# Patient Record
Sex: Female | Born: 1965
Health system: Southern US, Community
[De-identification: ages and names within clinical notes are randomized; demographics above are authoritative.]

---

## 2012-08-24 HISTORY — PX: TOTAL ABDOMINAL HYSTERECTOMY: SHX209

## 2012-08-24 HISTORY — PX: ABDOMINAL HYSTERECTOMY: SHX81

## 2012-12-14 ENCOUNTER — Ambulatory Visit: Payer: Self-pay | Admitting: Obstetrics & Gynecology

## 2012-12-14 LAB — CBC
HCT: 38.1 % (ref 35.0–47.0)
HGB: 12.5 g/dL (ref 12.0–16.0)
MCH: 30.5 pg (ref 26.0–34.0)
MCHC: 33 g/dL (ref 32.0–36.0)
MCV: 92 fL (ref 80–100)
Platelet: 268 10*3/uL (ref 150–440)
RBC: 4.12 10*6/uL (ref 3.80–5.20)
RDW: 13.9 % (ref 11.5–14.5)
WBC: 8.9 10*3/uL (ref 3.6–11.0)

## 2012-12-14 LAB — PREGNANCY, URINE: Pregnancy Test, Urine: NEGATIVE m[IU]/mL

## 2012-12-23 ENCOUNTER — Inpatient Hospital Stay: Payer: Self-pay | Admitting: Obstetrics & Gynecology

## 2012-12-24 LAB — HEMOGLOBIN: HGB: 9 g/dL — ABNORMAL LOW (ref 12.0–16.0)

## 2012-12-26 LAB — PATHOLOGY REPORT

## 2014-12-14 NOTE — Op Note (Signed)
PATIENT NAME:  Teresa Gutierrez, Teresa Gutierrez MR#:  [id number] DATE OF BIRTH:  [birth year]  DATE OF PROCEDURE:  12/23/2012  PREOPERATIVE DIAGNOSIS:  Fibroid uterus.   POSTOPERATIVE DIAGNOSIS:  Fibroid uterus.  PROCEDURE:  Total abdominal hysterectomy, and Diagnostic Laparoscopy.   SURGEON:  Annamarie Major, M.D.   ASSISTANTLuella Cook .   ANESTHESIA:  General.   ESTIMATED BLOOD LOSS:  350 mL.   COMPLICATIONS:  None.   FINDINGS:  Normal tubes, ovaries, appendix and bowel. The patient has significantly enlarged globular fibroid uterus. On laparoscopic visualization, the uterus was too wide and enlarged to complete the surgery laparoscopically. Cystoscopy was also performed with normal bladder and bilateral patent ureters.   DISPOSITION:  To the recovery room in stable condition.   TECHNIQUE:  The patient is prepped and draped in the usual sterile fashion after adequate anesthesia is obtained in the dorsal lithotomy position. A Foley catheter is inserted. A VCare device is placed on the cervix after the uterus is sounded to 14 cm.   Attention is then turned to the abdomen where a Veress needle is inserted through a 5 mm infraumbilical incision after Marcaine is used to anesthetize the skin. Veress needle placement is confirmed using the hanging drop technique, and the abdomen is then insufflated with CO2 gas. A 5 mm trocar is then inserted under direct visualization with the laparoscope with no injuries or bleeding noted. The patient was placed in Trendelenburg positioning. A 5 mm trocar is placed in the left lower quadrant and an 11 mm trocar is placed in the right lower quadrant lateral to the inferior epigastric blood vessels with no injuries or bleeding noted. The above-mentioned findings are visualized. It is determined a laparoscopic hysterectomy would not be possible due to the size of the uterus and the shape of the uterus with very wide base and somewhat adhesed to the pelvic sidewall on the right side,  thus open surgery is determined.   Scalpel was used to create a low transverse skin incision that is carried out down to the level of the rectus fascia. The rectus fascia was dissected bilaterally using Mayo scissors. Rectus muscles were separated from the fascia and then separated in the midline. Peritoneum is penetrated and the intra-abdominal cavity is penetrated with no injuries or bleeding noted. A Balfour retractor is placed for better visualization, and the patient remains in Trendelenburg positioning. The trocars from the laparoscopy are then removed.   Fibroid is identified and is stabilized with clamps and tenaculum. The round ligaments are carefully clamped, transected and suture ligated using Vicryl sutures. Anterior and posterior leaves of the broad ligaments were dissected, and the infundibulopelvic blood vessels and ligaments and the uterine artery blood vessels and ligaments are carefully clamped, transected  and suture ligated with preservation of the main blood supply to the ovaries on either side. Dissection is carried around the fibroids, along the cardinal ligament and broad ligaments with careful clamping and transecting and suture ligation to maintain hemostasis. Once the level of the uterine arteries were reached, they were clamped, transected and suture ligated. The uterus was amputated at this point for better visualization purposes. The cervix was grasped with a tenaculum and careful dissection along either lateral aspect was performed until the level of the external os was reached. The cervix was then completely amputated, and the vaginal cuff was closed with interrupted Vicryl sutures. Irrigation of the pelvic cavity was performed with aspiration of all fluid, and excellent hemostasis is noted. There was no apparent  injury to bladder, bowel or ureters.   Cystoscopy was performed with saline distention of the bladder with no injuries or bleeding noted. Indigo carmine dye is injected  IV, and then was later noted to emit through each ureteral orifice. Cystoscope was removed and catheter was replaced.   Pelvic cavity was irrigated, aspiration of all fluid is performed. Excellent hemostasis is noted. The rectus muscles are plicated using 0 Vicryl suture. Rectus fascia is closed with 0 Maxon suture. Subcutaneous tissues are irrigated and hemostasis is assured using electrocautery. Skin is closed with 4-0 Vicryl suture in a subcuticular fashion, followed by placement of Dermabond.   Dermabond is also used to close the laparoscopic incisions. The patient goes to the recovery room in stable condition. All sponge, instrument and needle counts were correct.   ____________________________ R. Annamarie MajorPaul Ketzaly Cardella, MD rph:dmm D: 12/23/2012 13:36:02 ET T: 12/23/2012 22:39:34 ET JOB#: [id number]  cc: Dierdre Searles. Paul Dabney Schanz, MD, <Dictator> Nadara MustardOBERT P Alvester Eads MD ELECTRONICALLY SIGNED 12/24/2012 8:46

## 2015-05-21 ENCOUNTER — Other Ambulatory Visit: Payer: Self-pay | Admitting: Internal Medicine

## 2015-05-21 ENCOUNTER — Encounter: Payer: Self-pay | Admitting: Internal Medicine

## 2015-05-21 ENCOUNTER — Ambulatory Visit (INDEPENDENT_AMBULATORY_CARE_PROVIDER_SITE_OTHER): Payer: 59 | Admitting: Internal Medicine

## 2015-05-21 VITALS — BP 170/102 | HR 84 | Ht 67.0 in | Wt 156.2 lb

## 2015-05-21 DIAGNOSIS — K219 Gastro-esophageal reflux disease without esophagitis: Secondary | ICD-10-CM | POA: Insufficient documentation

## 2015-05-21 DIAGNOSIS — M170 Bilateral primary osteoarthritis of knee: Secondary | ICD-10-CM | POA: Diagnosis not present

## 2015-05-21 DIAGNOSIS — I10 Essential (primary) hypertension: Secondary | ICD-10-CM | POA: Insufficient documentation

## 2015-05-21 DIAGNOSIS — E785 Hyperlipidemia, unspecified: Secondary | ICD-10-CM | POA: Insufficient documentation

## 2015-05-21 DIAGNOSIS — F17200 Nicotine dependence, unspecified, uncomplicated: Secondary | ICD-10-CM | POA: Diagnosis not present

## 2015-05-21 DIAGNOSIS — F17201 Nicotine dependence, unspecified, in remission: Secondary | ICD-10-CM | POA: Insufficient documentation

## 2015-05-21 DIAGNOSIS — M62838 Other muscle spasm: Secondary | ICD-10-CM | POA: Insufficient documentation

## 2015-05-21 MED ORDER — VALSARTAN 160 MG PO TABS: 160.0000 mg | ORAL_TABLET | Freq: Every day | ORAL | Status: DC

## 2015-05-21 MED ORDER — RANITIDINE HCL 150 MG PO CAPS: 150.0000 mg | ORAL_CAPSULE | Freq: Every evening | ORAL | Status: DC

## 2015-05-21 NOTE — Progress Notes (Signed)
Date:  05/21/2015   Name:  Teresa Gutierrez   DOB:  28-Jul-1966   MRN:  [id number]   Chief Complaint: Hypertension Hypertension This is a new problem. The current episode started in the past 7 days. The problem is unchanged. The problem is uncontrolled. Pertinent negatives include no chest pain, headaches, palpitations or shortness of breath. There are no associated agents to hypertension. Risk factors for coronary artery disease include smoking/tobacco exposure. Past treatments include nothing. There is no history of a thyroid problem. There is no history of chronic renal disease.  Gastrophageal Reflux She complains of abdominal pain and heartburn. She reports no chest pain, no coughing, no globus sensation, no nausea, no sore throat, no water brash or no wheezing. This is a recurrent problem. The problem occurs frequently. The problem has been unchanged. Pertinent negatives include no fatigue. She has tried a PPI (Prilosec causes diarrhea) for the symptoms.   She started checking her blood pressure over the past few days just to see what it was. The last time she had a blood pressure check was probably a year ago. She has a home cuff because her husband has hypertension. Blood pressures at home up in 170/100 consistently. She denies the use of stimulants diet pills sinus medication. She does smoke cigarettes. Bilateral knee pain - patient has noted some creaking in both knees for some time. She is limited stair climbing and kneeling as a result. The right knee causes her to have discomfort at times but she's not had any redness or swelling. She was very athletic when she was younger and may have had some previous injuries.  Review of Systems:  Review of Systems  Constitutional: Negative for fever, fatigue and unexpected weight change.  HENT: Negative for sore throat.   Eyes: Negative for visual disturbance.  Respiratory: Negative for cough, chest tightness, shortness of breath and wheezing.     Cardiovascular: Negative for chest pain, palpitations and leg swelling.  Gastrointestinal: Positive for heartburn and abdominal pain. Negative for nausea, vomiting and constipation.  Musculoskeletal: Positive for arthralgias (creaking of both knees with intermittent right-sided pain).  Neurological: Negative for dizziness, syncope and headaches.  Psychiatric/Behavioral: Negative for dysphoric mood and decreased concentration.    Patient Active Problem List   Diagnosis Date Noted  . Hyperlipidemia 05/21/2015  . Compulsive tobacco user syndrome 05/21/2015  . Muscle spasms of neck 05/21/2015    Prior to Admission medications   Not on File    No Known Allergies  Past Surgical History  Procedure Laterality Date  . Abdominal hysterectomy  2014    Social History  Substance Use Topics  . Smoking status: Current Every Day Smoker  . Smokeless tobacco: None  . Alcohol Use: 1.2 oz/week    2 Standard drinks or equivalent per week     Medication list has been reviewed and updated.  Physical Examination:  Physical Exam  Constitutional: She is oriented to person, place, and time. She appears well-developed. No distress.  HENT:  Head: Normocephalic and atraumatic.  Eyes: Conjunctivae are normal. Right eye exhibits no discharge. Left eye exhibits no discharge. No scleral icterus.  Neck: Normal range of motion. Neck supple. No thyromegaly present.  Cardiovascular: Normal rate, regular rhythm and normal heart sounds.   Pulmonary/Chest: Effort normal and breath sounds normal. No respiratory distress.  Abdominal: Soft.  Musculoskeletal: She exhibits no edema or tenderness.       Legs: Neurological: She is alert and oriented to person, place, and time.  Skin:  Skin is warm and dry. No rash noted.  Psychiatric: She has a normal mood and affect. Her behavior is normal. Thought content normal.  Nursing note and vitals reviewed.   BP 172/94 mmHg  Pulse 84  Ht  (1.702 m)  Wt 156  lb 3.2 oz (70.852 kg)  BMI 24.46 kg/m2  Assessment and Plan: 1. Essential hypertension Begin treatment Patient will monitor blood pressures at bedtime if not responding within 4 weeks return for follow-up otherwise return in 3 months - valsartan (DIOVAN) 160 MG tablet; Take 1 tablet (160 mg total) by mouth daily.  Dispense: 30 tablet; Refill: 3  2. Compulsive tobacco user syndrome Discussed options today Patient will try nicotine patch  3. Gastroesophageal reflux disease without esophagitis Having daily symptoms so will recommend ranitidine every afternoon - ranitidine (ZANTAC) 150 MG capsule; Take 1 capsule (150 mg total) by mouth every evening.  Dispense: 30 capsule; Refill: 3  4. Primary osteoarthritis of both knees Crepitance noted of both knees without effusions May continue over-the-counter nonsteroidals if needed Orthopedic consultation if symptoms worsen   Bari Edward, MD [facility]  05/21/2015

## 2015-05-21 NOTE — Patient Instructions (Signed)
Smoking Cessation Quitting smoking is important to your health and has many advantages. However, it is not always easy to quit since nicotine is a very addictive drug. Oftentimes, people try 3 times or more before being able to quit. This document explains the best ways for you to prepare to quit smoking. Quitting takes hard work and a lot of effort, but you can do it. ADVANTAGES OF QUITTING SMOKING  You will live longer, feel better, and live better.  Your body will feel the impact of quitting smoking almost immediately.  Within 20 minutes, blood pressure decreases. Your pulse returns to its normal level.  After 8 hours, carbon monoxide levels in the blood return to normal. Your oxygen level increases.  After 24 hours, the chance of having a heart attack starts to decrease. Your breath, hair, and body stop smelling like smoke.  After 48 hours, damaged nerve endings begin to recover. Your sense of taste and smell improve.  After 72 hours, the body is virtually free of nicotine. Your bronchial tubes relax and breathing becomes easier.  After 2 to 12 weeks, lungs can hold more air. Exercise becomes easier and circulation improves.  The risk of having a heart attack, stroke, cancer, or lung disease is greatly reduced.  After 1 year, the risk of coronary heart disease is cut in half.  After 5 years, the risk of stroke falls to the same as a nonsmoker.  After 10 years, the risk of lung cancer is cut in half and the risk of other cancers decreases significantly.  After 15 years, the risk of coronary heart disease drops, usually to the level of a nonsmoker.  If you are pregnant, quitting smoking will improve your chances of having a healthy baby.  The people you live with, especially any children, will be healthier.  You will have extra money to spend on things other than cigarettes. QUESTIONS TO THINK ABOUT BEFORE ATTEMPTING TO QUIT You may want to talk about your answers with your  health care provider.  Why do you want to quit?  If you tried to quit in the past, what helped and what did not?  What will be the most difficult situations for you after you quit? How will you plan to handle them?  Who can help you through the tough times? Your family? Friends? A health care provider?  What pleasures do you get from smoking? What ways can you still get pleasure if you quit? Here are some questions to ask your health care provider:  How can you help me to be successful at quitting?  What medicine do you think would be best for me and how should I take it?  What should I do if I need more help?  What is smoking withdrawal like? How can I get information on withdrawal? GET READY  Set a quit date.  Change your environment by getting rid of all cigarettes, ashtrays, matches, and lighters in your home, car, or work. Do not let people smoke in your home.  Review your past attempts to quit. Think about what worked and what did not. GET SUPPORT AND ENCOURAGEMENT You have a better chance of being successful if you have help. You can get support in many ways.  Tell your family, friends, and coworkers that you are going to quit and need their support. Ask them not to smoke around you.  Get individual, group, or telephone counseling and support. Programs are available at local hospitals and health centers. Call   your local health department for information about programs in your area.  Spiritual beliefs and practices may help some smokers quit.  Download a "quit meter" on your computer to keep track of quit statistics, such as how long you have gone without smoking, cigarettes not smoked, and money saved.  Get a self-help book about quitting smoking and staying off tobacco. LEARN NEW SKILLS AND BEHAVIORS  Distract yourself from urges to smoke. Talk to someone, go for a walk, or occupy your time with a task.  Change your normal routine. Take a different route to work.  Drink tea instead of coffee. Eat breakfast in a different place.  Reduce your stress. Take a hot bath, exercise, or read a book.  Plan something enjoyable to do every day. Reward yourself for not smoking.  Explore interactive web-based programs that specialize in helping you quit. GET MEDICINE AND USE IT CORRECTLY Medicines can help you stop smoking and decrease the urge to smoke. Combining medicine with the above behavioral methods and support can greatly increase your chances of successfully quitting smoking.  Nicotine replacement therapy helps deliver nicotine to your body without the negative effects and risks of smoking. Nicotine replacement therapy includes nicotine gum, lozenges, inhalers, nasal sprays, and skin patches. Some may be available over-the-counter and others require a prescription.  Antidepressant medicine helps people abstain from smoking, but how this works is unknown. This medicine is available by prescription.  Nicotinic receptor partial agonist medicine simulates the effect of nicotine in your brain. This medicine is available by prescription. Ask your health care provider for advice about which medicines to use and how to use them based on your health history. Your health care provider will tell you what side effects to look out for if you choose to be on a medicine or therapy. Carefully read the information on the package. Do not use any other product containing nicotine while using a nicotine replacement product.  RELAPSE OR DIFFICULT SITUATIONS Most relapses occur within the first 3 months after quitting. Do not be discouraged if you start smoking again. Remember, most people try several times before finally quitting. You may have symptoms of withdrawal because your body is used to nicotine. You may crave cigarettes, be irritable, feel very hungry, cough often, get headaches, or have difficulty concentrating. The withdrawal symptoms are only temporary. They are strongest  when you first quit, but they will go away within 10-14 days. To reduce the chances of relapse, try to:  Avoid drinking alcohol. Drinking lowers your chances of successfully quitting.  Reduce the amount of caffeine you consume. Once you quit smoking, the amount of caffeine in your body increases and can give you symptoms, such as a rapid heartbeat, sweating, and anxiety.  Avoid smokers because they can make you want to smoke.  Do not let weight gain distract you. Many smokers will gain weight when they quit, usually less than 10 pounds. Eat a healthy diet and stay active. You can always lose the weight gained after you quit.  Find ways to improve your mood other than smoking. FOR MORE INFORMATION  www.smokefree.gov  Document Released: 08/04/2001 Document Revised: 12/25/2013 Document Reviewed: 11/19/2011 Exit[employer] Patient Information 2015 [employer]. This information is not intended to replace advice given to you by your health care provider. Make sure you discuss any questions you have with your health care provider.  

## 2015-07-01 ENCOUNTER — Other Ambulatory Visit: Payer: Self-pay

## 2015-07-02 ENCOUNTER — Encounter: Payer: Self-pay | Admitting: Internal Medicine

## 2015-07-02 ENCOUNTER — Ambulatory Visit (INDEPENDENT_AMBULATORY_CARE_PROVIDER_SITE_OTHER): Payer: 59 | Admitting: Internal Medicine

## 2015-07-02 VITALS — BP 136/90 | HR 80 | Ht 67.0 in | Wt 159.0 lb

## 2015-07-02 DIAGNOSIS — I10 Essential (primary) hypertension: Secondary | ICD-10-CM

## 2015-07-02 NOTE — Progress Notes (Signed)
Date:  07/02/2015   Name:  Teresa Gutierrez   DOB:  06/16/1966   MRN:  [id number]   Chief Complaint: Hypertension Hypertension This is a new problem. The current episode started more than 1 month ago. The problem has been gradually improving since onset. Pertinent negatives include no chest pain, headaches, palpitations or shortness of breath. Past treatments include angiotensin blockers. The current treatment provides mild improvement.   Patient was started on Diovan last visit for blood pressures 170/100. Over the past 5 weeks or pressures have only improved to about 155/95 when measured at home. She feels well, no headache, chest pain, muscle cramps or nausea. She does state that the Diovan is $50 per month.  Review of Systems  Constitutional: Negative for fatigue.  Respiratory: Negative for shortness of breath and wheezing.   Cardiovascular: Negative for chest pain, palpitations and leg swelling.  Gastrointestinal: Negative for nausea and diarrhea.  Musculoskeletal: Negative for myalgias and arthralgias.  Neurological: Negative for numbness and headaches.    Patient Active Problem List   Diagnosis Date Noted  . Hyperlipidemia 05/21/2015  . Compulsive tobacco user syndrome 05/21/2015  . Muscle spasms of neck 05/21/2015  . Gastroesophageal reflux disease without esophagitis 05/21/2015  . Essential hypertension 05/21/2015    Prior to Admission medications   Medication Sig Start Date End Date Taking? Authorizing Provider  ranitidine (ZANTAC) 150 MG capsule Take 1 capsule (150 mg total) by mouth every evening. 05/21/15  Yes Reubin MilanLaura H Khristina Janota, MD  valsartan (DIOVAN) 160 MG tablet Take 1 tablet (160 mg total) by mouth daily. 05/21/15  Yes Reubin MilanLaura H Kinnie Kaupp, MD    No Known Allergies  Past Surgical History  Procedure Laterality Date  . Abdominal hysterectomy  2014    Social History  Substance Use Topics  . Smoking status: Current Every Day Smoker  . Smokeless tobacco: Not on file  .  Alcohol Use: 1.2 oz/week    2 Standard drinks or equivalent per week     Medication list has been reviewed and updated.   Physical Exam  Constitutional: She is oriented to person, place, and time. She appears well-developed. No distress.  HENT:  Head: Normocephalic and atraumatic.  Eyes: Conjunctivae are normal. Right eye exhibits no discharge. Left eye exhibits no discharge. No scleral icterus.  Cardiovascular: Normal rate, regular rhythm and normal heart sounds.   Pulmonary/Chest: Effort normal and breath sounds normal. No respiratory distress. She has no wheezes.  Musculoskeletal: Normal range of motion. She exhibits no edema.  Neurological: She is alert and oriented to person, place, and time.  Skin: Skin is warm and dry. No rash noted.  Psychiatric: She has a normal mood and affect. Her behavior is normal. Thought content normal.    BP 136/90 mmHg  Pulse 80  Ht 5\' 7"  (1.702 m)  Wt 159 lb (72.122 kg)  BMI 24.90 kg/m2  Assessment and Plan: 1. Essential hypertension Blood pressure improved but not at goal Continue diovan for now Investigate others ARBs that are more cost effective Consider adding diuretic for additional benefit   Bari EdwardLaura Mat Stuard, MD [facility]  07/02/2015

## 2015-07-03 ENCOUNTER — Other Ambulatory Visit: Payer: Self-pay | Admitting: Internal Medicine

## 2015-07-03 MED ORDER — IRBESARTAN 300 MG PO TABS: 300.0000 mg | ORAL_TABLET | Freq: Every day | ORAL | Status: DC

## 2015-07-05 ENCOUNTER — Other Ambulatory Visit: Payer: Self-pay | Admitting: Internal Medicine

## 2015-07-05 MED ORDER — IRBESARTAN 300 MG PO TABS: 300.0000 mg | ORAL_TABLET | Freq: Every day | ORAL | Status: DC

## 2015-07-08 ENCOUNTER — Telehealth: Payer: Self-pay

## 2015-07-08 NOTE — Telephone Encounter (Signed)
I have sent the order for avapro to [facility] twice so far last week.  I don't know what else I can do.

## 2015-07-08 NOTE — Telephone Encounter (Signed)
Patient states insurance will pay for Avapro and [facility] is supposed to be send refill request.

## 2015-08-20 ENCOUNTER — Ambulatory Visit: Payer: Self-pay | Admitting: Internal Medicine

## 2015-08-29 ENCOUNTER — Ambulatory Visit (INDEPENDENT_AMBULATORY_CARE_PROVIDER_SITE_OTHER): Payer: 59 | Admitting: Internal Medicine

## 2015-08-29 ENCOUNTER — Encounter: Payer: Self-pay | Admitting: Internal Medicine

## 2015-08-29 VITALS — BP 128/86 | HR 84 | Ht 67.0 in | Wt 171.2 lb

## 2015-08-29 DIAGNOSIS — E785 Hyperlipidemia, unspecified: Secondary | ICD-10-CM | POA: Diagnosis not present

## 2015-08-29 DIAGNOSIS — I10 Essential (primary) hypertension: Secondary | ICD-10-CM | POA: Diagnosis not present

## 2015-08-29 DIAGNOSIS — K219 Gastro-esophageal reflux disease without esophagitis: Secondary | ICD-10-CM

## 2015-08-29 DIAGNOSIS — Z0000 Encounter for general adult medical examination without abnormal findings: Secondary | ICD-10-CM | POA: Diagnosis not present

## 2015-08-29 LAB — POCT URINALYSIS DIPSTICK
Bilirubin, UA: NEGATIVE
Blood, UA: NEGATIVE
Glucose, UA: NEGATIVE
Ketones, UA: NEGATIVE
Leukocytes, UA: NEGATIVE
Nitrite, UA: NEGATIVE
Protein, UA: NEGATIVE
Spec Grav, UA: 1.015
Urobilinogen, UA: 0.2
pH, UA: 6

## 2015-08-29 NOTE — Patient Instructions (Signed)
Breast Self-Awareness Practicing breast self-awareness may pick up problems early, prevent significant medical complications, and possibly save your life. By practicing breast self-awareness, you can become familiar with how your breasts look and feel and if your breasts are changing. This allows you to notice changes early. It can also offer you some reassurance that your breast health is good. One way to learn what is normal for your breasts and whether your breasts are changing is to do a breast self-exam. If you find a lump or something that was not present in the past, it is best to contact your caregiver right away. Other findings that should be evaluated by your caregiver include nipple discharge, especially if it is bloody; skin changes or reddening; areas where the skin seems to be pulled in (retracted); or new lumps and bumps. Breast pain is seldom associated with cancer (malignancy), but should also be evaluated by a caregiver. HOW TO PERFORM A BREAST SELF-EXAM The best time to examine your breasts is 5-7 days after your menstrual period is over. During menstruation, the breasts are lumpier, and it may be more difficult to pick up changes. If you do not menstruate, have reached menopause, or had your uterus removed (hysterectomy), you should examine your breasts at regular intervals, such as monthly. If you are breastfeeding, examine your breasts after a feeding or after using a breast pump. Breast implants do not decrease the risk for lumps or tumors, so continue to perform breast self-exams as recommended. Talk to your caregiver about how to determine the difference between the implant and breast tissue. Also, talk about the amount of pressure you should use during the exam. Over time, you will become more familiar with the variations of your breasts and more comfortable with the exam. A breast self-exam requires you to remove all your clothes above the waist. 1. Look at your breasts and nipples.  Stand in front of a mirror in a room with good lighting. With your hands on your hips, push your hands firmly downward. Look for a difference in shape, contour, and size from one breast to the other (asymmetry). Asymmetry includes puckers, dips, or bumps. Also, look for skin changes, such as reddened or scaly areas on the breasts. Look for nipple changes, such as discharge, dimpling, repositioning, or redness. 2. Carefully feel your breasts. This is best done either in the shower or tub while using soapy water or when flat on your back. Place the arm (on the side of the breast you are examining) above your head. Use the pads (not the fingertips) of your three middle fingers on your opposite hand to feel your breasts. Start in the underarm area and use  inch (2 cm) overlapping circles to feel your breast. Use 3 different levels of pressure (light, medium, and firm pressure) at each circle before moving to the next circle. The light pressure is needed to feel the tissue closest to the skin. The medium pressure will help to feel breast tissue a little deeper, while the firm pressure is needed to feel the tissue close to the ribs. Continue the overlapping circles, moving downward over the breast until you feel your ribs below your breast. Then, move one finger-width towards the center of the body. Continue to use the  inch (2 cm) overlapping circles to feel your breast as you move slowly up toward the collar bone (clavicle) near the base of the neck. Continue the up and down exam using all 3 pressures until you reach the   middle of the chest. Do this with each breast, carefully feeling for lumps or changes. 3.  Keep a written record with breast changes or normal findings for each breast. By writing this information down, you do not need to depend only on memory for size, tenderness, or location. Write down where you are in your menstrual cycle, if you are still menstruating. Breast tissue can have some lumps or  thick tissue. However, see your caregiver if you find anything that concerns you.  SEEK MEDICAL CARE IF:  You see a change in shape, contour, or size of your breasts or nipples.   You see skin changes, such as reddened or scaly areas on the breasts or nipples.   You have an unusual discharge from your nipples.   You feel a new lump or unusually thick areas.    This information is not intended to replace advice given to you by your health care provider. Make sure you discuss any questions you have with your health care provider.   Document Released: 08/10/2005 Document Revised: 07/27/2012 Document Reviewed: 11/25/2011 Elsevier Interactive Patient Education 2016 Elsevier Inc.  

## 2015-08-29 NOTE — Progress Notes (Signed)
Date:  08/29/2015   Name:  Teresa Gutierrez   DOB:  1965/10/26   MRN:  [id number]   Chief Complaint: Annual Exam; Hypertension; and Gastroesophageal Reflux Teresa Gutierrez is a 50 y.o. female who presents today for her Complete Annual Exam. She feels well. She reports exercising none. She reports she is sleeping well. Mammogram is due. She [facility]. She continues to smoke cigarettes but is cutting back. She declines flu vaccine and she declines tetanus vaccine.  Hypertension This is a chronic problem. The current episode started more than 1 month ago (in the past 6 months - now on avapro). The problem is unchanged. The problem is controlled. Pertinent negatives include no chest pain, headaches, orthopnea, palpitations or shortness of breath. There are no associated agents to hypertension. Risk factors for coronary artery disease include smoking/tobacco exposure. Past treatments include angiotensin blockers. The current treatment provides significant improvement. There are no compliance problems.   Gastroesophageal Reflux She complains of heartburn. She reports no abdominal pain, no chest pain, no coughing or no wheezing. This is a chronic problem. The current episode started more than 1 year ago. The problem occurs rarely. The problem has been unchanged. The treatment provided significant relief.     Review of Systems  Constitutional: Negative for fever, chills and diaphoresis.  HENT: Negative for sinus pressure, tinnitus and trouble swallowing.   Respiratory: Negative for cough, chest tightness, shortness of breath and wheezing.   Cardiovascular: Negative for chest pain, palpitations, orthopnea and leg swelling.  Gastrointestinal: Positive for heartburn. Negative for abdominal pain and constipation.  Genitourinary: Negative for dysuria.  Musculoskeletal: Negative for back pain, arthralgias and gait problem.  Skin: Positive for rash (lesion on back - scaly). Negative for color  change.  Neurological: Negative for syncope and headaches.  Hematological: Negative for adenopathy.  Psychiatric/Behavioral: Negative for sleep disturbance and dysphoric mood. The patient is not nervous/anxious.     Patient Active Problem List   Diagnosis Date Noted  . Hyperlipidemia 05/21/2015  . Compulsive tobacco user syndrome 05/21/2015  . Muscle spasms of neck 05/21/2015  . Gastroesophageal reflux disease without esophagitis 05/21/2015  . Essential hypertension 05/21/2015    Prior to Admission medications   Medication Sig Start Date End Date Taking? Authorizing Provider  Biotin 5000 MCG CAPS Take by mouth.   Yes Historical Provider, MD  Cholecalciferol (VITAMIN D-3) 1000 units CAPS Take by mouth.   Yes Historical Provider, MD  irbesartan (AVAPRO) 300 MG tablet Take 1 tablet (300 mg total) by mouth daily. 07/05/15  Yes Reubin MilanLaura H Berglund, MD  ranitidine (ZANTAC) 150 MG capsule Take 1 capsule (150 mg total) by mouth every evening. 05/21/15  Yes Reubin MilanLaura H Berglund, MD    No Known Allergies  Past Surgical History  Procedure Laterality Date  . Abdominal hysterectomy  2014    ovaries remain    Social History  Substance Use Topics  . Smoking status: Current Every Day Smoker -- 0.50 packs/day for 22 years    Types: Cigarettes  . Smokeless tobacco: None  . Alcohol Use: 1.2 oz/week    2 Standard drinks or equivalent per week    Medication list has been reviewed and updated.   Physical Exam  Constitutional: She is oriented to person, place, and time. She appears well-developed and well-nourished. No distress.  HENT:  Head: Normocephalic and atraumatic.  Nose: Right sinus exhibits no maxillary sinus tenderness. Left sinus exhibits no maxillary sinus tenderness.  Mouth/Throat: Oropharynx is clear and moist.  Eyes: Conjunctivae and EOM are normal. Right eye exhibits no discharge. Left eye exhibits no discharge. No scleral icterus.  Neck: Normal range of motion. Neck supple.  Carotid bruit is not present. No erythema present. No thyromegaly present.  Cardiovascular: Normal rate, regular rhythm, normal heart sounds and normal pulses.   Pulmonary/Chest: Effort normal and breath sounds normal. No respiratory distress. She has no wheezes.  Abdominal: Soft. Bowel sounds are normal. There is no hepatosplenomegaly. There is no tenderness. There is no CVA tenderness.  Musculoskeletal: Normal range of motion.  Lymphadenopathy:    She has no cervical adenopathy.    She has no axillary adenopathy.  Neurological: She is alert and oriented to person, place, and time. She has normal reflexes. No cranial nerve deficit or sensory deficit.  Skin: Skin is warm, dry and intact. No rash noted.     Psychiatric: She has a normal mood and affect. Her speech is normal and behavior is normal. Thought content normal.  Nursing note and vitals reviewed.   BP 128/86 mmHg  Pulse 84  Ht 5\' 7"  (1.702 m)  Wt 171 lb 3.2 oz (77.656 kg)  BMI 26.81 kg/m2  Assessment and Plan: 1. Annual physical exam Pap pelvic and mammograms through GYN Patient declines flu vaccine and tetanus vaccine Will need colonoscopy next year Lesion on back is a keratosis - can consult dermatology for removal if persistently irritated - POCT Urinalysis Dipstick  2. Essential hypertension Well-controlled; continue current medication; f/u 6 mo. - Comprehensive metabolic panel - TSH  3. Gastroesophageal reflux disease without esophagitis Continue daily H2 blocker - CBC with Differential/Platelet  4. Hyperlipidemia Continue healthy diet and exercise Will advise if medications are needed - Lipid panel   Bari Edward, MD [facility]  08/29/2015

## 2015-08-30 ENCOUNTER — Telehealth: Payer: Self-pay

## 2015-08-30 LAB — CBC WITH DIFFERENTIAL/PLATELET
Basophils Absolute: 0 10*3/uL (ref 0.0–0.2)
Basos: 0 %
EOS (ABSOLUTE): 0.2 10*3/uL (ref 0.0–0.4)
Eos: 1 %
Hematocrit: 40.2 % (ref 34.0–46.6)
Hemoglobin: 13.5 g/dL (ref 11.1–15.9)
Immature Grans (Abs): 0 10*3/uL (ref 0.0–0.1)
Immature Granulocytes: 0 %
Lymphocytes Absolute: 2 10*3/uL (ref 0.7–3.1)
Lymphs: 19 %
MCH: 31.2 pg (ref 26.6–33.0)
MCHC: 33.6 g/dL (ref 31.5–35.7)
MCV: 93 fL (ref 79–97)
Monocytes Absolute: 0.7 10*3/uL (ref 0.1–0.9)
Monocytes: 6 %
Neutrophils Absolute: 7.5 10*3/uL — ABNORMAL HIGH (ref 1.4–7.0)
Neutrophils: 74 %
Platelets: 268 10*3/uL (ref 150–379)
RBC: 4.33 x10E6/uL (ref 3.77–5.28)
RDW: 13.5 % (ref 12.3–15.4)
WBC: 10.4 10*3/uL (ref 3.4–10.8)

## 2015-08-30 LAB — COMPREHENSIVE METABOLIC PANEL
ALT: 11 IU/L (ref 0–32)
AST: 23 IU/L (ref 0–40)
Albumin/Globulin Ratio: 2.4 (ref 1.1–2.5)
Albumin: 4.8 g/dL (ref 3.5–5.5)
Alkaline Phosphatase: 106 IU/L (ref 39–117)
BUN/Creatinine Ratio: 18 (ref 9–23)
BUN: 12 mg/dL (ref 6–24)
Bilirubin Total: 0.7 mg/dL (ref 0.0–1.2)
CO2: 23 mmol/L (ref 18–29)
Calcium: 9.5 mg/dL (ref 8.7–10.2)
Chloride: 99 mmol/L (ref 96–106)
Creatinine, Ser: 0.68 mg/dL (ref 0.57–1.00)
GFR calc Af Amer: 119 mL/min/{1.73_m2} (ref >59)
GFR calc non Af Amer: 103 mL/min/{1.73_m2} (ref >59)
Globulin, Total: 2 g/dL (ref 1.5–4.5)
Glucose: 100 mg/dL — ABNORMAL HIGH (ref 65–99)
Potassium: 4.3 mmol/L (ref 3.5–5.2)
Sodium: 140 mmol/L (ref 134–144)
Total Protein: 6.8 g/dL (ref 6.0–8.5)

## 2015-08-30 LAB — LIPID PANEL
Chol/HDL Ratio: 4.9 ratio units — ABNORMAL HIGH (ref 0.0–4.4)
Cholesterol, Total: 220 mg/dL — ABNORMAL HIGH (ref 100–199)
HDL: 45 mg/dL (ref >39)
LDL Calculated: 131 mg/dL — ABNORMAL HIGH (ref 0–99)
Triglycerides: 220 mg/dL — ABNORMAL HIGH (ref 0–149)
VLDL Cholesterol Cal: 44 mg/dL — ABNORMAL HIGH (ref 5–40)

## 2015-08-30 LAB — TSH: TSH: 3.88 u[IU]/mL (ref 0.450–4.500)

## 2015-08-30 NOTE — Telephone Encounter (Signed)
-----   Message from Reubin MilanLaura H Berglund, MD sent at 08/30/2015  8:06 AM EST ----- Labs are all good except continued borderline high cholesterol.  10 yr risk of ASCVD is 6% so medication is not recommended yet.  Continue to work on smoking cessation since this will reduce risk to 3%.

## 2015-08-30 NOTE — Telephone Encounter (Signed)
Spoke with pt. Pt. Advised of results and will call back with any questions or concerns. Patient verbalized understanding. [facility]

## 2015-11-26 ENCOUNTER — Other Ambulatory Visit: Payer: Self-pay | Admitting: Internal Medicine

## 2015-12-02 ENCOUNTER — Ambulatory Visit (INDEPENDENT_AMBULATORY_CARE_PROVIDER_SITE_OTHER): Payer: 59

## 2015-12-02 ENCOUNTER — Ambulatory Visit (INDEPENDENT_AMBULATORY_CARE_PROVIDER_SITE_OTHER)
Admit: 2015-12-02 | Discharge: 2015-12-02 | Disposition: A | Discharge status: Home / Self Care | Payer: 59 | Attending: Family Medicine | Admitting: Family Medicine

## 2015-12-02 ENCOUNTER — Ambulatory Visit
Admission: EM | Admit: 2015-12-02 | Discharge: 2015-12-02 | Disposition: A | Discharge status: Home / Self Care | Payer: 59 | Attending: Family Medicine | Admitting: Family Medicine

## 2015-12-02 ENCOUNTER — Encounter: Payer: Self-pay | Admitting: *Deleted

## 2015-12-02 DIAGNOSIS — Z833 Family history of diabetes mellitus: Secondary | ICD-10-CM | POA: Diagnosis not present

## 2015-12-02 DIAGNOSIS — R918 Other nonspecific abnormal finding of lung field: Secondary | ICD-10-CM | POA: Insufficient documentation

## 2015-12-02 DIAGNOSIS — M940 Chondrocostal junction syndrome [Tietze]: Secondary | ICD-10-CM

## 2015-12-02 DIAGNOSIS — I10 Essential (primary) hypertension: Secondary | ICD-10-CM | POA: Insufficient documentation

## 2015-12-02 DIAGNOSIS — I459 Conduction disorder, unspecified: Secondary | ICD-10-CM | POA: Diagnosis not present

## 2015-12-02 DIAGNOSIS — J439 Emphysema, unspecified: Secondary | ICD-10-CM

## 2015-12-02 DIAGNOSIS — R109 Unspecified abdominal pain: Secondary | ICD-10-CM | POA: Insufficient documentation

## 2015-12-02 DIAGNOSIS — R938 Abnormal findings on diagnostic imaging of other specified body structures: Secondary | ICD-10-CM | POA: Diagnosis not present

## 2015-12-02 DIAGNOSIS — F17210 Nicotine dependence, cigarettes, uncomplicated: Secondary | ICD-10-CM | POA: Insufficient documentation

## 2015-12-02 DIAGNOSIS — Z8249 Family history of ischemic heart disease and other diseases of the circulatory system: Secondary | ICD-10-CM | POA: Insufficient documentation

## 2015-12-02 DIAGNOSIS — M549 Dorsalgia, unspecified: Secondary | ICD-10-CM | POA: Diagnosis present

## 2015-12-02 LAB — CBC WITH DIFFERENTIAL/PLATELET
Basophils Absolute: 0.2 10*3/uL — ABNORMAL HIGH (ref 0–0.1)
Basophils Relative: 1 %
Eosinophils Absolute: 0.2 10*3/uL (ref 0–0.7)
Eosinophils Relative: 1 %
HCT: 45.9 % (ref 35.0–47.0)
Hemoglobin: 15.5 g/dL (ref 12.0–16.0)
Lymphocytes Relative: 15 %
Lymphs Abs: 2.2 10*3/uL (ref 1.0–3.6)
MCH: 31.1 pg (ref 26.0–34.0)
MCHC: 33.8 g/dL (ref 32.0–36.0)
MCV: 92.2 fL (ref 80.0–100.0)
Monocytes Absolute: 0.9 10*3/uL (ref 0.2–0.9)
Monocytes Relative: 6 %
Neutro Abs: 11.9 10*3/uL — ABNORMAL HIGH (ref 1.4–6.5)
Neutrophils Relative %: 77 %
Platelets: 299 10*3/uL (ref 150–440)
RBC: 4.98 MIL/uL (ref 3.80–5.20)
RDW: 13.2 % (ref 11.5–14.5)
WBC: 15.4 10*3/uL — ABNORMAL HIGH (ref 3.6–11.0)

## 2015-12-02 LAB — COMPREHENSIVE METABOLIC PANEL
ALT: 15 U/L (ref 14–54)
AST: 17 U/L (ref 15–41)
Albumin: 5 g/dL (ref 3.5–5.0)
Alkaline Phosphatase: 122 U/L (ref 38–126)
Anion gap: 8 (ref 5–15)
BUN: 13 mg/dL (ref 6–20)
CO2: 26 mmol/L (ref 22–32)
Calcium: 9.6 mg/dL (ref 8.9–10.3)
Chloride: 102 mmol/L (ref 101–111)
Creatinine, Ser: 0.65 mg/dL (ref 0.44–1.00)
GFR calc Af Amer: >60 mL/min (ref >60)
GFR calc non Af Amer: >60 mL/min (ref >60)
Glucose, Bld: 96 mg/dL (ref 65–99)
Potassium: 4 mmol/L (ref 3.5–5.1)
Sodium: 136 mmol/L (ref 135–145)
Total Bilirubin: 0.6 mg/dL (ref 0.3–1.2)
Total Protein: 7.7 g/dL (ref 6.5–8.1)

## 2015-12-02 LAB — CKMB (ARMC ONLY): CK, MB: 1.1 ng/mL (ref 0.5–5.0)

## 2015-12-02 LAB — CK: Total CK: 49 U/L (ref 38–234)

## 2015-12-02 LAB — FIBRIN DERIVATIVES D-DIMER (ARMC ONLY): Fibrin derivatives D-dimer (ARMC): 273 (ref 0–499)

## 2015-12-02 LAB — TROPONIN I: Troponin I: <0.03 ng/mL (ref <0.031)

## 2015-12-02 MED ORDER — KETOROLAC TROMETHAMINE 60 MG/2ML IM SOLN
60.0000 mg | Freq: Once | INTRAMUSCULAR | Status: AC
  Administered 2015-12-02: 60 mg via INTRAMUSCULAR

## 2015-12-02 MED ORDER — MELOXICAM 15 MG PO TABS: 15.0000 mg | ORAL_TABLET | Freq: Every day | ORAL | Status: DC

## 2015-12-02 NOTE — ED Notes (Signed)
LUQ abd pain that radiates to left shoulder and back, onset yesterday morning. Worse with deep breathing or movement.

## 2015-12-02 NOTE — Discharge Instructions (Signed)
Chest Wall Pain Chest wall pain is pain in or around the bones and muscles of your chest. Sometimes, an injury causes this pain. Sometimes, the cause may not be known. This pain may take several weeks or longer to get better. HOME CARE Pay attention to any changes in your symptoms. Take these actions to help with your pain:  Rest as told by your doctor.  Avoid activities that cause pain. Try not to use your chest, belly (abdominal), or side muscles to lift heavy things.  If directed, apply ice to the painful area:  Put ice in a plastic bag.  Place a towel between your skin and the bag.  Leave the ice on for 20 minutes, 2-3 times per day.  Take over-the-counter and prescription medicines only as told by your doctor.  Do not use tobacco products, including cigarettes, chewing tobacco, and e-cigarettes. If you need help quitting, ask your doctor.  Keep all follow-up visits as told by your doctor. This is important. GET HELP IF:  You have a fever.  Your chest pain gets worse.  You have new symptoms. GET HELP RIGHT AWAY IF:  You feel sick to your stomach (nauseous) or you throw up (vomit).  You feel sweaty or light-headed.  You have a cough with phlegm (sputum) or you cough up blood.  You are short of breath.   This information is not intended to replace advice given to you by your health care provider. Make sure you discuss any questions you have with your health care provider.   Document Released: 01/27/2008 Document Revised: 05/01/2015 Document Reviewed: 11/05/2014 Elsevier Interactive Patient Education 2016 Elsevier Inc.  Chronic Obstructive Pulmonary Disease Chronic obstructive pulmonary disease (COPD) is a common lung problem. In COPD, the flow of air from the lungs is limited. The way your lungs work will probably never return to normal, but there are things you can do to improve your lungs and make yourself feel better. Your doctor may treat your condition  with:  Medicines.  Oxygen.  Lung surgery.  Changes to your diet.  Rehabilitation. This may involve a team of specialists. HOME CARE  Take all medicines as told by your doctor.  Avoid medicines or cough syrups that dry up your airway (such as antihistamines) and do not allow you to get rid of thick spit. You do not need to avoid them if told differently by your doctor.  If you smoke, stop. Smoking makes the problem worse.  Avoid being around things that make your breathing worse (like smoke, chemicals, and fumes).  Use oxygen therapy and therapy to help improve your lungs (pulmonary rehabilitation) if told by your doctor. If you need home oxygen therapy, ask your doctor if you should buy a tool to measure your oxygen level (oximeter).  Avoid people who have a sickness you can catch (contagious).  Avoid going outside when it is very hot, cold, or humid.  Eat healthy foods. Eat smaller meals more often. Rest before meals.  Stay active, but remember to also rest.  Make sure to get all the shots (vaccines) your doctor recommends. Ask your doctor if you need a pneumonia shot.  Learn and use tips on how to relax.  Learn and use tips on how to control your breathing as told by your doctor. Try:  Breathing in (inhaling) through your nose for 1 second. Then, pucker your lips and breath out (exhale) through your lips for 2 seconds.  Putting one hand on your belly (abdomen). Breathe  in slowly through your nose for 1 second. Your hand on your belly should move out. Pucker your lips and breathe out slowly through your lips. Your hand on your belly should move in as you breathe out.  Learn and use controlled coughing to clear thick spit from your lungs. The steps are:  Lean your head a little forward.  Breathe in deeply.  Try to hold your breath for 3 seconds.  Keep your mouth slightly open while coughing 2 times.  Spit any thick spit out into a tissue.  Rest and do the steps  again 1 or 2 times as needed. GET HELP IF:  You cough up more thick spit than usual.  There is a change in the color or thickness of the spit.  It is harder to breathe than usual.  Your breathing is faster than usual. GET HELP RIGHT AWAY IF:  You have shortness of breath while resting.  You have shortness of breath that stops you from:  Being able to talk.  Doing normal activities.  You chest hurts for longer than 5 minutes.  Your skin color is more blue than usual.  Your pulse oximeter shows that you have low oxygen for longer than 5 minutes. MAKE SURE YOU:  Understand these instructions.  Will watch your condition.  Will get help right away if you are not doing well or get worse.   This information is not intended to replace advice given to you by your health care provider. Make sure you discuss any questions you have with your health care provider.   Document Released: 01/27/2008 Document Revised: 08/31/2014 Document Reviewed: 04/06/2013 Elsevier Interactive Patient Education 2016 [employer].  Costochondritis Costochondritis is a condition in which the tissue (cartilage) that connects your ribs with your breastbone (sternum) becomes irritated. It causes pain in the chest and rib area. It usually goes away on its own over time. HOME CARE  Avoid activities that wear you out.  Do not strain your ribs. Avoid activities that use your:  Chest.  Belly.  Side muscles.  Put ice on the area for the first 2 days after the pain starts.  Put ice in a plastic bag.  Place a towel between your skin and the bag.  Leave the ice on for 20 minutes, 2-3 times a day.  Only take medicine as told by your doctor. GET HELP IF:  You have redness or puffiness (swelling) in the rib area.  Your pain does not go away with rest or medicine. GET HELP RIGHT AWAY IF:   Your pain gets worse.  You are very uncomfortable.  You have trouble breathing.  You cough up  blood.  You start sweating or throwing up (vomiting).  You have a fever or lasting symptoms for more than 2-3 days.  You have a fever and your symptoms suddenly get worse. MAKE SURE YOU:   Understand these instructions.  Will watch your condition.  Will get help right away if you are not doing well or get worse.   This information is not intended to replace advice given to you by your health care provider. Make sure you discuss any questions you have with your health care provider.   Document Released: 01/27/2008 Document Revised: 04/12/2013 Document Reviewed: 03/14/2013 Elsevier Interactive Patient Education 2016 Else[employer] Inc. Pulmonary Nodule  A pulmonary nodule is a small, round spot in your lung. It is usually found when pictures of your lungs are taken for other reasons. Most pulmonary nodules are  not cancerous and do not cause symptoms. Tests will be done to make sure the nodule is not cancerous. Pulmonary nodules that are not cancerous usually do not require treatment. HOME CARE   Only take medicine as told by your doctor.  Follow up with your doctor as told. GET HELP IF:  You have trouble breathing when doing activities.  You feel sick or more tired than normal.  You do not feel like eating.  You lose weight without trying to.  You have chills.  You have night sweats. GET HELP RIGHT AWAY IF:  You cannot catch your breath.  You start making whistling sounds when breathing (wheezing).  You have a cough that does not go away.  You cough up blood.  You are dizzy or feel like you are going to pass out.  You have sudden chest pain.  You have a fever or lasting symptoms for more than 2-3 days.  You have a fever and your symptoms suddenly get worse. MAKE SURE YOU:  Understand these instructions.  Will watch your condition.  Will get help right away if you are not doing well or get worse.   This information is not intended to replace advice given to you  by your health care provider. Make sure you discuss any questions you have with your health care provider.   Document Released: 09/12/2010 Document Revised: 12/25/2014 Document Reviewed: 01/30/2013 Elsevier Interactive Patient Education [employer]

## 2015-12-02 NOTE — ED Notes (Signed)
Received prior authorization for CT chest without contrast from patient's insurance [id number].

## 2015-12-02 NOTE — ED Provider Notes (Signed)
CSN: [id number]     Arrival date & time 12/02/15  3818 History   First MD Initiated Contact with Patient 12/02/15 0848    Nurses notes were reviewed.  Chief Complaint  Patient presents with  . Abdominal Pain  . Back Pain    Patient reports having chest pain that started yesterday pain developed from her left lower chest/upper abdomen and upper chest and down the left arm and upper neck. She's had this pain since yesterday and it has been persistent and she describes pain at about 8 out of 10 now. Patient denies any injury to the chest no known history of coronary artery disease. Patient does smoke does have hypertension as well. Past history no known drug allergies she has had abdominal hysterectomy. Mother has had hypertension and heart failure and diabetes.  She denies any nausea vomiting or diarrhea. No chest congestion. No known history sudden death in the family.     (Consider location/radiation/quality/duration/timing/severity/associated sxs/prior Treatment) Patient is a 50 y.o. female presenting with abdominal pain, back pain, and chest pain. The history is provided by the patient and a significant other. No language interpreter was used.  Abdominal Pain Pain location:  LUQ Pain quality: pressure, sharp, squeezing and stabbing   Pain radiates to:  LUQ Onset quality:  Sudden Progression:  Worsening Chronicity:  New Context: not recent illness, not recent sexual activity, not sick contacts and not suspicious food intake   Relieved by:  Nothing Worsened by:  Movement Ineffective treatments:  None tried Associated symptoms: chest pain   Associated symptoms: no cough, no dysuria, no fatigue and no shortness of breath   Back Pain Associated symptoms: abdominal pain and chest pain   Associated symptoms: no dysuria   Chest Pain Pain location:  Substernal area, L chest and epigastric Pain quality: radiating, sharp, shooting and stabbing   Pain radiates to:  Neck, L arm, epigastrium  and L jaw Pain severity:  Moderate Onset quality:  Sudden Timing:  Constant Chronicity:  New Context: breathing, lifting, movement and at rest   Associated symptoms: abdominal pain   Associated symptoms: no back pain, no cough, no dizziness, no fatigue and no shortness of breath   Risk factors: hypertension and smoking   Risk factors: no coronary artery disease, no high cholesterol, not female, no prior DVT/PE and no surgery     History reviewed. No pertinent past medical history. Past Surgical History  Procedure Laterality Date  . Abdominal hysterectomy  2014    ovaries remain   Family History  Problem Relation Age of Onset  . Hypertension Mother   . Heart failure Mother   . Diabetes Mother    Social History  Substance Use Topics  . Smoking status: Current Every Day Smoker -- 0.50 packs/day for 22 years    Types: Cigarettes  . Smokeless tobacco: None  . Alcohol Use: 1.2 oz/week    2 Standard drinks or equivalent per week   OB History    No data available     Review of Systems  Constitutional: Negative for activity change, appetite change and fatigue.  HENT: Negative for congestion and ear discharge.   Respiratory: Negative for cough, shortness of breath and wheezing.   Cardiovascular: Positive for chest pain.  Gastrointestinal: Positive for abdominal pain.  Genitourinary: Negative for dysuria, frequency and dyspareunia.  Musculoskeletal: Negative for myalgias, back pain, joint swelling and neck pain.  Skin: Negative for color change.  Neurological: Negative for dizziness.  All other systems reviewed and  are negative.   Allergies  Review of patient's allergies indicates no known allergies.  Home Medications   Prior to Admission medications   Medication Sig Start Date End Date Taking? Authorizing Provider  Biotin 5000 MCG CAPS Take by mouth.   Yes Historical Provider, MD  Cholecalciferol (VITAMIN D-3) 1000 units CAPS Take by mouth.   Yes Historical Provider, MD    irbesartan (AVAPRO) 300 MG tablet Take 1 tablet by mouth  daily 11/26/15  Yes Reubin Milan, MD  ranitidine (ZANTAC) 150 MG capsule Take 1 capsule (150 mg total) by mouth every evening. 05/21/15  Yes Reubin Milan, MD  meloxicam (MOBIC) 15 MG tablet Take 1 tablet (15 mg total) by mouth daily. 12/02/15   Hassan Rowan, MD   Meds Ordered and Administered this Visit   Medications  ketorolac (TORADOL) injection 60 mg (60 mg Intramuscular Given 12/02/15 0942)    BP 150/99 mmHg  Pulse 89  Temp(Src) 98.2 F (36.8 C) (Oral)  Resp 16  Ht  (1.702 m)  Wt 155 lb (70.308 kg)  BMI 24.27 kg/m2  SpO2 100% No data found.   Physical Exam  Constitutional: She is oriented to person, place, and time. She appears well-developed and well-nourished. No distress.  HENT:  Head: Normocephalic and atraumatic.  Right Ear: External ear normal.  Left Ear: External ear normal.  Mouth/Throat: Oropharynx is clear and moist.  Eyes: Conjunctivae are normal. Pupils are equal, round, and reactive to light.  Neck: Normal range of motion.  Cardiovascular: Normal rate and regular rhythm.  Exam reveals no gallop and no friction rub.   No murmur heard. Pulmonary/Chest: Effort normal and breath sounds normal. No respiratory distress. She has no decreased breath sounds. She has no wheezes. She exhibits tenderness.    Abdominal: Soft. There is tenderness.  Musculoskeletal: Normal range of motion.  Neurological: She is alert and oriented to person, place, and time.  Skin: Skin is warm and dry.  Psychiatric: She has a normal mood and affect.  Vitals reviewed.   ED Course  Procedures (including critical care time)  Labs Review Labs Reviewed  CBC WITH DIFFERENTIAL/PLATELET - Abnormal; Notable for the following:    WBC 15.4 (*)    Neutro Abs 11.9 (*)    Basophils Absolute 0.2 (*)    All other components within normal limits  COMPREHENSIVE METABOLIC PANEL  TROPONIN I  FIBRIN DERIVATIVES D-DIMER ([facility] ONLY)   CK  CKMB([facility] ONLY)    Imaging Review Dg Chest 2 View  12/02/2015  CLINICAL DATA:  Bilateral rib pain.  History of bronchitis. EXAM: CHEST  2 VIEW COMPARISON:  No prior. FINDINGS: Mediastinum and hilar structures are normal. 8 mm slightly irregular nodule in the left apex. This may be calcified. Questionable density is noted in the right lower lobe medially. This may be vascular. For further evaluation these findings nonenhanced chest CT is suggested. No focal alveolar infiltrate. Heart size normal. No pleural effusion or pneumothorax. No acute bony abnormality. IMPRESSION: 8 mm slightly irregular nodule noted in the left pulmonary apex, possibly calcified. Questionable nodular density in the medial right lung base. This may be vascular. Although these 2 findings may be benign further evaluation with nonenhanced chest CT is suggested. Electronically Signed   By: Maisie Fus  Register   On: 12/02/2015 09:41   Ct Chest Wo Contrast  12/02/2015  CLINICAL DATA:  Abnormal chest x-ray.  Lung nodule.  Smoker EXAM: CT CHEST WITHOUT CONTRAST TECHNIQUE: Multidetector CT imaging of the chest  was performed following the standard protocol without IV contrast. COMPARISON:  Chest x-ray today FINDINGS: 8 mm calcified granuloma left upper lobe corresponding to the chest x-ray abnormality Density in the right medial lung base appears to represent pulmonary vein. No mass or nodule is seen in this area on CT. 6 x 8 mm subpleural nodule right upper lobe which is noncalcified. Early apical emphysema. Negative for infiltrate or effusion. Negative for mass or adenopathy. Mediastinal structures appear normal. Minimal calcification in the left coronary artery. Upper abdomen negative.  Negative most musculoskeletal structures IMPRESSION: Calcified granuloma left upper lobe does not require further imaging evaluation No lung nodule in the right lower lobe. The chest x-ray shadow appears to represent pulmonary vein. 6 x 8 mm subpleural  nodule right upper lobe.  Recommendation below Early emphysema Non-contrast chest CT at 6-12 months is recommended. If the nodule is stable at time of repeat CT, then future CT at 18-24 months (from today's scan) is considered optional for low-risk patients, but is recommended for high-risk patients. This recommendation follows the consensus statement: Guidelines for Management of Incidental Pulmonary Nodules Detected on CT Images:From the Fleischner Society 2017; published online before print (10.1148/[id number]). Electronically Signed   By: Marlan Palauharles  Clark M.D.   On: 12/02/2015 13:23     Visual Acuity Review  Right Eye Distance:   Left Eye Distance:   Bilateral Distance:    Right Eye Near:   Left Eye Near:    Bilateral Near:     Results for orders placed or performed during the hospital encounter of 12/02/15  CBC with Differential  Result Value Ref Range   WBC 15.4 (H) 3.6 - 11.0 K/uL   RBC 4.98 3.80 - 5.20 MIL/uL   Hemoglobin 15.5 12.0 - 16.0 g/dL   HCT 62.145.9 30.835.0 - 65.747.0 %   MCV 92.2 80.0 - 100.0 fL   MCH 31.1 26.0 - 34.0 pg   MCHC 33.8 32.0 - 36.0 g/dL   RDW 84.613.2 96.211.5 - 95.214.5 %   Platelets 299 150 - 440 K/uL   Neutrophils Relative % 77 %   Neutro Abs 11.9 (H) 1.4 - 6.5 K/uL   Lymphocytes Relative 15 %   Lymphs Abs 2.2 1.0 - 3.6 K/uL   Monocytes Relative 6 %   Monocytes Absolute 0.9 0.2 - 0.9 K/uL   Eosinophils Relative 1 %   Eosinophils Absolute 0.2 0 - 0.7 K/uL   Basophils Relative 1 %   Basophils Absolute 0.2 (H) 0 - 0.1 K/uL  Comprehensive metabolic panel  Result Value Ref Range   Sodium 136 135 - 145 mmol/L   Potassium 4.0 3.5 - 5.1 mmol/L   Chloride 102 101 - 111 mmol/L   CO2 26 22 - 32 mmol/L   Glucose, Bld 96 65 - 99 mg/dL   BUN 13 6 - 20 mg/dL   Creatinine, Ser 8.410.65 0.44 - 1.00 mg/dL   Calcium 9.6 8.9 - 32.410.3 mg/dL   Total Protein 7.7 6.5 - 8.1 g/dL   Albumin 5.0 3.5 - 5.0 g/dL   AST 17 15 - 41 U/L   ALT 15 14 - 54 U/L   Alkaline Phosphatase 122 38 -  126 U/L   Total Bilirubin 0.6 0.3 - 1.2 mg/dL   GFR calc non Af Amer >60 >60 mL/min   GFR calc Af Amer >60 >60 mL/min   Anion gap 8 5 - 15  Troponin I  Result Value Ref Range   Troponin I <0.03 <0.031 ng/mL  Fibrin derivatives D-Dimer ([facility] only)  Result Value Ref Range   Fibrin derivatives D-dimer ([facility]) 273 0 - 499  CK  Result Value Ref Range   Total CK 49 38 - 234 U/L  CKMB([facility] only)  Result Value Ref Range   CK, MB 1.1 0.5 - 5.0 ng/mL      MDM   1. Costochondritis, acute   2. Abnormal CXR with multiple nodules   3. Pulmonary emphysema, unspecified emphysema type (HCC)   4. Right ventricular conduction delay    Patient was given 60 Toradol with some improvement of her pain. This chest x-ray was abnormal CT scan was obtained which showed some emphysematous changes and repeat recommendation of CT in 6-12 months. Patient will be warned her need to stop smoking. Cardiac enzymes all normal. White count was 15,000 will give a work note for today and tomorrow. We'll place on Mobic 15 mg 1 tablet day for pain and discomfort. Since mild time spent with the patient.   Patient is at high risk due to her smoking history. Patient she will need have repeat CT scan in 6 months this is another good reason for her to stop smoking. We'll diagnose her costochondritis will strongly recommend Mobic 15 mg 1 tablet a day was again cessation of all smoking and follow-up PCP for repeat CT scan in 6 months. We'll try to make a copy of the CT scan the patient does have abnormal EKG which she also needs follow-up for PCP as well. Work note for today and tomorrow will be given.     ED ECG REPORT I, Taheerah Guldin H, the attending physician, personally viewed and interpreted this ECG.   Date: 12/02/2015  EKG Time: 11:02:07  Rate:76  Rhythm: there are no previous tracings available for comparison, normal sinus rhythm  Axis: 44  Intervals:nonspecific intraventricular conduction delay  ST&T Change:  none  Borderline EKG there is right ventricular conduction delay and left atrial enlargement   Hassan Rowan, MD 12/02/15 1349

## 2015-12-02 NOTE — ED Notes (Signed)
[facility]

## 2015-12-09 ENCOUNTER — Encounter: Payer: Self-pay | Admitting: Internal Medicine

## 2015-12-09 ENCOUNTER — Ambulatory Visit (INDEPENDENT_AMBULATORY_CARE_PROVIDER_SITE_OTHER): Payer: 59 | Admitting: Internal Medicine

## 2015-12-09 VITALS — BP 112/64 | HR 84 | Ht 67.0 in | Wt 154.2 lb

## 2015-12-09 DIAGNOSIS — J439 Emphysema, unspecified: Secondary | ICD-10-CM | POA: Diagnosis not present

## 2015-12-09 DIAGNOSIS — R911 Solitary pulmonary nodule: Secondary | ICD-10-CM | POA: Diagnosis not present

## 2015-12-09 DIAGNOSIS — F17200 Nicotine dependence, unspecified, uncomplicated: Secondary | ICD-10-CM | POA: Diagnosis not present

## 2015-12-09 DIAGNOSIS — D72829 Elevated white blood cell count, unspecified: Secondary | ICD-10-CM | POA: Diagnosis not present

## 2015-12-09 NOTE — Progress Notes (Signed)
Date:  12/09/2015   Name:  Teresa Gutierrez   DOB:  06/21/1966   MRN:  [id number]   Chief Complaint: Lung Lesion HPI Patient was seen in UC last week with chest pain - labs and EKG were essentially normal.  Pain felt to be costochondritis.  However, in the workup she had a chest CT to rule out pneumonia.  This showed a calcified granuloma and a RUL nodule as well as early apical emphysema. Her WBC was also elevated at 15K. She was discharged with Mobic and follow up here. She now feels well - no further chest pain.  She has cut back smoking to 2 cigs per day using patches.  She does not think she still needs Mobic.  Review of Systems  Constitutional: Negative for fever, chills and fatigue.  HENT: Negative for hearing loss.   Eyes: Negative for visual disturbance.  Respiratory: Negative for cough, chest tightness, shortness of breath and wheezing.   Cardiovascular: Negative for chest pain, palpitations and leg swelling.  Neurological: Negative for dizziness and headaches.  Psychiatric/Behavioral: Negative for sleep disturbance. The patient is not nervous/anxious.     Patient Active Problem List   Diagnosis Date Noted  . Emphysema of lung (HCC) 12/09/2015  . Solitary pulmonary nodule 12/09/2015  . Hyperlipidemia 05/21/2015  . Compulsive tobacco user syndrome 05/21/2015  . Muscle spasms of neck 05/21/2015  . Gastroesophageal reflux disease without esophagitis 05/21/2015  . Essential hypertension 05/21/2015    Prior to Admission medications   Medication Sig Start Date End Date Taking? Authorizing Provider  Biotin 5000 MCG CAPS Take by mouth.   Yes Historical Provider, MD  Cholecalciferol (VITAMIN D-3) 1000 units CAPS Take by mouth.   Yes Historical Provider, MD  irbesartan (AVAPRO) 300 MG tablet Take 1 tablet by mouth  daily 11/26/15  Yes Reubin MilanLaura H Dalisha Shively, MD  meloxicam (MOBIC) 15 MG tablet Take 1 tablet (15 mg total) by mouth daily. 12/02/15  Yes Hassan RowanEugene Wade, MD  ranitidine  (ZANTAC) 150 MG capsule Take 1 capsule (150 mg total) by mouth every evening. 05/21/15  Yes Reubin MilanLaura H Suheyla Mortellaro, MD    No Known Allergies  Past Surgical History  Procedure Laterality Date  . Abdominal hysterectomy  2014    ovaries remain    Social History  Substance Use Topics  . Smoking status: Current Every Day Smoker -- 0.50 packs/day for 22 years    Types: Cigarettes  . Smokeless tobacco: None     Comment: Recently cut back to 2 cigs daily (12/09/15)  . Alcohol Use: 1.2 oz/week    2 Standard drinks or equivalent per week     Medication list has been reviewed and updated.   Physical Exam  Constitutional: She is oriented to person, place, and time. She appears well-developed. No distress.  HENT:  Head: Normocephalic and atraumatic.  Neck: Normal range of motion.  Cardiovascular: Normal rate, regular rhythm and normal heart sounds.   Pulmonary/Chest: Effort normal and breath sounds normal. No respiratory distress. She has no wheezes. She has no rales. She exhibits no tenderness.  Musculoskeletal: Normal range of motion. She exhibits no edema or tenderness.  Lymphadenopathy:    She has no cervical adenopathy.  Neurological: She is alert and oriented to person, place, and time.  Skin: Skin is warm and dry. No rash noted.  Psychiatric: She has a normal mood and affect. Her behavior is normal. Thought content normal.  Nursing note and vitals reviewed.   BP 112/64 mmHg  Pulse 84  Ht  (1.702 m)  Wt 154 lb 3.2 oz (69.945 kg)  BMI 24.15 kg/m2  Assessment and Plan: 1. Solitary pulmonary nodule Will schedule follow up CT for December - CT Chest Wo Contrast; Future  2. Pulmonary emphysema, unspecified emphysema type (HCC) Doing well with cessation efforts - continue patches - patient advised she can use them longer than 6 weeks if needed  3. Compulsive tobacco user syndrome improving  4. Leukocytosis Will recheck CBC - no source of infection noted - CBC with  Differential/Platelet   Bari Edward, MD [facility]  12/09/2015

## 2015-12-10 ENCOUNTER — Telehealth: Payer: Self-pay

## 2015-12-10 LAB — CBC WITH DIFFERENTIAL/PLATELET
Basophils Absolute: 0.1 x10E3/uL (ref 0.0–0.2)
Basos: 1 %
EOS (ABSOLUTE): 0.3 x10E3/uL (ref 0.0–0.4)
Eos: 3 %
Hematocrit: 38.8 % (ref 34.0–46.6)
Hemoglobin: 13 g/dL (ref 11.1–15.9)
Immature Grans (Abs): 0 x10E3/uL (ref 0.0–0.1)
Immature Granulocytes: 0 %
Lymphocytes Absolute: 2.2 x10E3/uL (ref 0.7–3.1)
Lymphs: 22 %
MCH: 30.2 pg (ref 26.6–33.0)
MCHC: 33.5 g/dL (ref 31.5–35.7)
MCV: 90 fL (ref 79–97)
Monocytes Absolute: 0.8 x10E3/uL (ref 0.1–0.9)
Monocytes: 8 %
Neutrophils Absolute: 6.8 x10E3/uL (ref 1.4–7.0)
Neutrophils: 66 %
Platelets: 289 x10E3/uL (ref 150–379)
RBC: 4.31 x10E6/uL (ref 3.77–5.28)
RDW: 13.3 % (ref 12.3–15.4)
WBC: 10.1 x10E3/uL (ref 3.4–10.8)

## 2015-12-10 NOTE — Telephone Encounter (Signed)
-----   Message from Reubin MilanLaura H Berglund, MD sent at 12/10/2015  7:53 AM EDT ----- Blood count is now normal.

## 2015-12-10 NOTE — Telephone Encounter (Signed)
Spoke with patient. Patient advised of all results and verbalized understanding. Will call back with any future questions or concerns. [facility]  

## 2016-02-27 ENCOUNTER — Encounter: Payer: Self-pay | Admitting: Internal Medicine

## 2016-02-27 ENCOUNTER — Ambulatory Visit (INDEPENDENT_AMBULATORY_CARE_PROVIDER_SITE_OTHER): Payer: 59 | Admitting: Internal Medicine

## 2016-02-27 VITALS — BP 104/74 | HR 79 | Resp 16 | Ht 67.0 in | Wt 156.0 lb

## 2016-02-27 DIAGNOSIS — Z720 Tobacco use: Secondary | ICD-10-CM | POA: Diagnosis not present

## 2016-02-27 DIAGNOSIS — R911 Solitary pulmonary nodule: Secondary | ICD-10-CM

## 2016-02-27 DIAGNOSIS — I10 Essential (primary) hypertension: Secondary | ICD-10-CM

## 2016-02-27 DIAGNOSIS — F17200 Nicotine dependence, unspecified, uncomplicated: Secondary | ICD-10-CM

## 2016-02-27 NOTE — Progress Notes (Signed)
    Date:  02/27/2016   Name:  Teresa Gutierrez   DOB:  [birth year]   MRN:  [id number]   Chief Complaint: Hypertension Hypertension This is a chronic problem. The current episode started more than 1 year ago. The problem is unchanged. The problem is controlled. Pertinent negatives include no chest pain, headaches or shortness of breath.  She feels well. No headaches dizziness or shortness of breath. No cough. She has a small pulmonary nodule that will be followed up by CT scan in December. She continues to work on quitting smoking. Using patches and a placebo e-cigarette she is down to 2 regular cigarettes per day.    Review of Systems  Constitutional: Negative for fever, chills and unexpected weight change.  Respiratory: Negative for choking, shortness of breath and wheezing.   Cardiovascular: Negative for chest pain and leg swelling.  Neurological: Negative for dizziness and headaches.    Patient Active Problem List   Diagnosis Date Noted  . Emphysema of lung (HCC) 12/09/2015  . Solitary pulmonary nodule 12/09/2015  . Hyperlipidemia 05/21/2015  . Compulsive tobacco user syndrome 05/21/2015  . Muscle spasms of neck 05/21/2015  . Gastroesophageal reflux disease without esophagitis 05/21/2015  . Essential hypertension 05/21/2015    Prior to Admission medications   Medication Sig Start Date End Date Taking? Authorizing Provider  Biotin 5000 MCG CAPS Take by mouth.   Yes Historical Provider, MD  Cholecalciferol (VITAMIN D-3) 1000 units CAPS Take by mouth.   Yes Historical Provider, MD  irbesartan (AVAPRO) 300 MG tablet Take 1 tablet by mouth  daily 11/26/15  Yes Reubin MilanLaura H Sreekar Broyhill, MD  meloxicam (MOBIC) 15 MG tablet Take 1 tablet (15 mg total) by mouth daily. 12/02/15  Yes Hassan RowanEugene Wade, MD  ranitidine (ZANTAC) 150 MG capsule Take 1 capsule (150 mg total) by mouth every evening. 05/21/15  Yes Reubin MilanLaura H Ark Agrusa, MD    No Known Allergies  Past Surgical History  Procedure Laterality Date  .  Abdominal hysterectomy  2014    ovaries remain    Social History  Substance Use Topics  . Smoking status: Current Every Day Smoker -- 0.50 packs/day for 22 years    Types: Cigarettes  . Smokeless tobacco: None     Comment: Recently cut back to 2 cigs daily (12/09/15)  . Alcohol Use: 1.2 oz/week    2 Standard drinks or equivalent per week     Medication list has been reviewed and updated.   Physical Exam  Constitutional: She is oriented to person, place, and time. She appears well-developed. No distress.  HENT:  Head: Normocephalic and atraumatic.  Pulmonary/Chest: Effort normal. No respiratory distress.  Musculoskeletal: Normal range of motion.  Neurological: She is alert and oriented to person, place, and time.  Skin: Skin is warm and dry. No rash noted.  Psychiatric: She has a normal mood and affect. Her behavior is normal. Thought content normal.  Nursing note and vitals reviewed.   BP 104/74 mmHg  Pulse 79  Resp 16  Ht 5\' 7"  (1.702 m)  Wt 156 lb (70.761 kg)  BMI 24.43 kg/m2  SpO2 97%  Assessment and Plan: 1. Essential hypertension Controlled - continue current therapy  2. Current smoker Pt congratulated on efforts - continue to work toward complete cessation - Nurse to provide smoking / tobacco cessation education  3. Solitary pulmonary nodule Follow up CT planned for December   Bari EdwardLaura Nigel Ericsson, MD [facility]  02/27/2016

## 2016-04-03 ENCOUNTER — Other Ambulatory Visit: Payer: Self-pay | Admitting: Internal Medicine

## 2016-04-03 MED ORDER — IRBESARTAN 300 MG PO TABS: 300.0000 mg | ORAL_TABLET | Freq: Every day | ORAL | 3 refills | Status: DC

## 2016-04-29 ENCOUNTER — Other Ambulatory Visit: Payer: Self-pay | Admitting: Internal Medicine

## 2016-04-29 ENCOUNTER — Ambulatory Visit
Admission: RE | Admit: 2016-04-29 | Discharge: 2016-04-29 | Disposition: A | Discharge status: Home / Self Care | Payer: BLUE CROSS/BLUE SHIELD | Source: Ambulatory Visit | Attending: Internal Medicine | Admitting: Internal Medicine

## 2016-04-29 ENCOUNTER — Encounter: Payer: Self-pay | Admitting: Internal Medicine

## 2016-04-29 ENCOUNTER — Ambulatory Visit (INDEPENDENT_AMBULATORY_CARE_PROVIDER_SITE_OTHER): Payer: BLUE CROSS/BLUE SHIELD | Admitting: Internal Medicine

## 2016-04-29 ENCOUNTER — Telehealth: Payer: Self-pay

## 2016-04-29 VITALS — BP 138/92 | HR 87 | Resp 16 | Ht 67.0 in | Wt 158.0 lb

## 2016-04-29 DIAGNOSIS — M5430 Sciatica, unspecified side: Secondary | ICD-10-CM | POA: Diagnosis not present

## 2016-04-29 DIAGNOSIS — M5440 Lumbago with sciatica, unspecified side: Secondary | ICD-10-CM

## 2016-04-29 DIAGNOSIS — M542 Cervicalgia: Secondary | ICD-10-CM

## 2016-04-29 DIAGNOSIS — M50321 Other cervical disc degeneration at C4-C5 level: Secondary | ICD-10-CM | POA: Insufficient documentation

## 2016-04-29 DIAGNOSIS — M5137 Other intervertebral disc degeneration, lumbosacral region: Secondary | ICD-10-CM | POA: Diagnosis not present

## 2016-04-29 DIAGNOSIS — M545 Low back pain: Secondary | ICD-10-CM

## 2016-04-29 DIAGNOSIS — M5010 Cervical disc disorder with radiculopathy, unspecified cervical region: Secondary | ICD-10-CM | POA: Insufficient documentation

## 2016-04-29 MED ORDER — MELOXICAM 15 MG PO TABS: 15.0000 mg | ORAL_TABLET | Freq: Every day | ORAL | 0 refills | Status: DC

## 2016-04-29 MED ORDER — METHOCARBAMOL 500 MG PO TABS: 500.0000 mg | ORAL_TABLET | Freq: Four times a day (QID) | ORAL | 0 refills | Status: DC

## 2016-04-29 NOTE — Telephone Encounter (Signed)
Patient no longer has [employer] and has switched to [employer]. CT on Dec. 13, 2017 [facility] and needs to get re[facility]approved by [facility].

## 2016-04-29 NOTE — Progress Notes (Signed)
Date:  04/29/2016   Name:  Teresa Gutierrez   DOB:  Aug 28, 1965   MRN:  [id number]   Chief Complaint: Back Pain (1 week- pain shoots down left leg and back up left arm. Lower back pain chronic issue but worsening. )  She complains of low back pain that has been intermittent until recently. Now it seems to be more constant sometimes associated with shooting pain from her left buttock down to her left knee. She denies weakness or numbness. She has not had x-rays recently. She's not been taking any anti-inflammatories or muscle relaxants.  Neck pain - she has mild chronic neck stiffness and discomfort with decreased range of motion. Recently she's had some left upper arm decreased sensation. It feels as though there is a tight band around her arm. He denies any weakness or giving way. No recent injury. 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  Review of Systems  Constitutional: Negative for diaphoresis and fever.  Respiratory: Negative for chest tightness and shortness of breath.   Cardiovascular: Negative for chest pain.  Gastrointestinal: Negative for abdominal pain.  Genitourinary: Negative for difficulty urinating.  Musculoskeletal: Positive for arthralgias, back pain, gait problem, myalgias and neck stiffness. Negative for joint swelling and neck pain.  Skin: Negative for rash.  Neurological: Negative for dizziness, tremors, weakness, numbness and headaches.    Patient Active Problem List   Diagnosis Date Noted  . Emphysema of lung (HCC) 12/09/2015  . Solitary pulmonary nodule 12/09/2015  . Hyperlipidemia 05/21/2015  . Compulsive tobacco user syndrome 05/21/2015  . Muscle spasms of neck 05/21/2015  . Gastroesophageal reflux disease without esophagitis 05/21/2015  . Essential hypertension 05/21/2015    Prior to Admission medications   Medication Sig Start Date End Date Taking? Authorizing Provider  Biotin 5000 MCG CAPS Take by mouth.   Yes  Historical Provider, MD  Cholecalciferol (VITAMIN D-3) 1000 units CAPS Take by mouth.   Yes Historical Provider, MD  irbesartan (AVAPRO) 300 MG tablet Take 1 tablet (300 mg total) by mouth daily. 04/03/16  Yes Reubin Milan, MD  ranitidine (ZANTAC) 150 MG capsule Take 1 capsule (150 mg total) by mouth every evening. 05/21/15  Yes Reubin Milan, MD    No Known Allergies  Past Surgical History:  Procedure Laterality Date  . ABDOMINAL HYSTERECTOMY  2014   ovaries remain    Social History  Substance Use Topics  . Smoking status: Current Every Day Smoker    Packs/day: 0.50    Years: 22.00    Types: Cigarettes  . Smokeless tobacco: Never Used     Comment: Recently cut back to 2 cigs daily (12/09/15)  . Alcohol use 1.2 oz/week    2 Standard drinks or equivalent per week     Medication list has been reviewed and updated.   Physical Exam  Constitutional: She is oriented to person, place, and time. She appears well-developed. No distress.  HENT:  Head: Normocephalic and atraumatic.  Cardiovascular: Normal rate and normal heart sounds.   Pulmonary/Chest: Effort normal and breath sounds normal. No respiratory distress.  Musculoskeletal:       Cervical back: She exhibits decreased range of motion and tenderness.       Lumbar back: She exhibits tenderness and spasm.  Neurological: She is alert and oriented to person, place, and time. She has normal reflexes.  SLR negative bilaterally  Skin: Skin is warm and dry. No rash noted.  Psychiatric: She  has a normal mood and affect. Her behavior is normal. Thought content normal.  Nursing note and vitals reviewed.   BP (!) 138/92 (BP Location: Right Arm, Patient Position: Sitting, Cuff Size: Normal)   Pulse 87   Resp 16   Ht 5\' 7"  (1.702 m)   Wt 158 lb (71.7 kg)   SpO2 97%   BMI 24.75 kg/m   Assessment and Plan: 1. Cervical pain (neck) - DG Cervical Spine Complete; Future  2. Back pain of lumbar region with sciatica Consider  PTx referral if Xrays unremarkable - DG Lumbar Spine Complete; Future - methocarbamol (ROBAXIN) 500 MG tablet; Take 1 tablet (500 mg total) by mouth 4 (four) times daily.  Dispense: 120 tablet; Refill: 0 - meloxicam (MOBIC) 15 MG tablet; Take 1 tablet (15 mg total) by mouth daily.  Dispense: 30 tablet; Refill: 0   Bari EdwardLaura Jay Haskew, MD [facility]  04/29/2016

## 2016-05-06 ENCOUNTER — Ambulatory Visit: Payer: BLUE CROSS/BLUE SHIELD | Attending: Internal Medicine | Admitting: Physical Therapy

## 2016-05-06 ENCOUNTER — Encounter: Payer: Self-pay | Admitting: Physical Therapy

## 2016-05-06 DIAGNOSIS — M62830 Muscle spasm of back: Secondary | ICD-10-CM | POA: Diagnosis present

## 2016-05-06 DIAGNOSIS — R293 Abnormal posture: Secondary | ICD-10-CM

## 2016-05-06 DIAGNOSIS — M542 Cervicalgia: Secondary | ICD-10-CM

## 2016-05-06 DIAGNOSIS — M5442 Lumbago with sciatica, left side: Secondary | ICD-10-CM | POA: Diagnosis present

## 2016-05-06 NOTE — Therapy (Signed)
[place] [facility] [address] [place], [place], [address] Phone: [phone]   Fax:  [phone]  Physical Therapy Evaluation  Patient Details  Name: Teresa Gutierrez MRN: [id number] Date of Birth: [birth year] Referring Provider: Reubin MilanLaura H Berglund MD  Encounter Date: 05/06/2016      PT End of Session - 05/06/16 0847    Visit Number 1   Number of Visits 8   Date for PT Re-Evaluation 06/03/16   PT Start Time 0728   PT Stop Time 0817   PT Time Calculation (min) 49 min   Activity Tolerance Patient tolerated treatment well   Behavior During Therapy [facility] for tasks assessed/performed      History reviewed. No pertinent past medical history.  Past Surgical History:  Procedure Laterality Date  . ABDOMINAL HYSTERECTOMY  2014   ovaries remain    There were no vitals filed for this visit.       Subjective Assessment - 05/06/16 0840    Subjective Patient reports a chronic history of low back and neck pain. States that low back pain first started showing up when she was vacuuming or moving the lawn and would be aggravated for several days and then resolve. She states that over the last two weeks the back pain has worsened and her sporadic pain has become more constant in nature. She is aggravated by leaning forward and lifting large objects. States that rest, sitting in a recliner and muscle relaxers are helpful for her lower back pain. She denies feelings of N/T in UE or LE but states she feels a "nerve being pinched"/tightness that radiates down her left arm. She experiences shooting pain in LLE during ambulatory tasks with several episodes of knee buckling secondary to sudden/severe pain.   Pertinent History 20 year hx of chronic low back and neck pain. Sx exacerbated 2 weeks ago with pt reporting near constant pain in low back/neck.   Limitations Sitting;Lifting;Standing;Walking;House hold activities   How long can you walk comfortably? < 15  minutes   Diagnostic tests X-ray Lumbar Spine: Degenerative changes at L5-S1 with 4mm of anterolisthesis at L5-S1. X-ray Cervical Spine: Degenerative disc disease at C4-5, C5-6, C6-7.   Patient Stated Goals pt would like to not be in constant back/neck pain so she can carry out ADLs and begin exercise program   Currently in Pain? Yes   Pain Score 5    Pain Location Back   Pain Orientation Lower   Pain Descriptors / Indicators Constant   Pain Type Chronic pain   Aggravating Factors  lifting, lowering, bending forward   Pain Relieving Factors rest, muscle relaxers   Multiple Pain Sites Yes   Pain Score 5   Pain Location Neck   Pain Orientation Lower;Distal;Posterior   Pain Descriptors / Indicators Constant   Pain Type Chronic pain   Aggravating Factors  quick movements/head turns            [facility] Assessment - 05/06/16 0001      Assessment   Medical Diagnosis Back pain of lumbar region with sciatica and Cervical disc disorder with radiculopathy   Referring Provider Reubin MilanLaura H Berglund MD   Onset Date/Surgical Date 09/06/15   Hand Dominance Right   Prior Therapy none      Objective:  Therapeutic Exercise: Seated piriformis 45 sec x1 RLE, Supine piriformis stretch 45 sec x1 LLE. Standing hamstring stretch 45 sec x2 RLE/LLE. Standing hip flexor stretch with table 45 sec x2 RLE/LLE. Cat/camel x10.  Upper trap/levator scap stretch 45 sec x2. Seated cervical retraction x10.   Pt response for medical necessity: Pt presents with generalized hypomobility of cervical spine and lumbar spine with accompanying L hip stiffness secondary to muscle tightness (hamstring/piriformis/hip flexor). She is able to perform all issued stretches/mobility exercises without exacerbation of pain with mild difficulty with set up for seated piriformis stretch on LLE; better tolerance of supine/hooklying position. She will benefit from skilled PT to mobilize cervical/lumbar spine and address postural stability to  promote return to PLOF and decrease in overall pain.        PT Education - 05/06/16 0847    Education provided Yes   Education Details See HEP   Person(s) Educated Patient   Methods Explanation;Demonstration;Handout   Comprehension Verbalized understanding;Returned demonstration             PT Long Term Goals - 05/06/16 0906      PT LONG TERM GOAL #1   Title Pt will complete MODI, NDI, LEFS, FABQ to determine functional mobility and fear avoidance as it pertains to LBP/cervical pain to promote objective measure of pt progress   Baseline outcome measures not completed   Time 1   Period Weeks   Status New     PT LONG TERM GOAL #2   Title Pt will report decrease of constant pain level in neck and low back/L leg to <3/10 while performing ADLs to promote return to PLOF   Baseline pt states she is "taking the week off" secondary to 5/10 constant LBP/sciatica/neck pain with acute excaerbations    Time 4   Period Weeks   Status New     PT LONG TERM GOAL #3   Title Pt will demonstrate equivalent RLE/LLE strength per MMT to promote decreased aggravation of sciatica pain and improvement of functional mobility   Baseline LE R/L hip flexion 5/4- (onset of sciatica pain), knee ext 5/4 (onset of sciatica pain), knee flexion 5/5, dorsiflexion 5/4 (onset of sciatica pain), plantarflexion 5/4+ (onset of sciatica pain).    Time 4   Period Weeks   Status New     PT LONG TERM GOAL #4   Title Pt will demonstrate tolerate cervical ROM to 40 deg cervical flexion, 50 deg R/L rotation, 30 deg R/L lateral flexion to promote functional ROM so pt is safe with driving/tasks that require quick head movements   Baseline Cervical flexion 36 deg with inclinometer, Ext WNL, R rot 42 deg, L rot 45 deg, R lateral flexion 22, L lateral flexion 17   Time 4   Period Weeks   Status New            Plan - 05/06/16 0904    Clinical Impression Statement Pt is a pleasant 50 year old female referred to  physical therapy for worsening of chronic low back and neck pain. She experiences constant neck/back pain 5/10, with acute episodes of 8/10 pain in both neck and back. Over the last two weeks she has not experienced 0/10 pain even at rest. She demonstrates tendency for mild slouching posture with forward head and thoracic kyphosis especially with seated posture. ROM: Cervical flexion 36 deg with inclinometer, Ext WNL, R rot 42 deg, L rot 45 deg, R lateral flexion 22, L lateral flexion 17; pt experiences end range stiffness in cervical flexion with mild end range pain all directions for cervical rotation and lateral flexion. Lumbar flexion fingertips at distal tibia (pain at end range), ext WNL (pain at end range), ext  x10 no change in sx, R/L rotation and lateral flexion with mild limitations (stiffness at end range but no pain). Strength MMT: UE R/L shoulder flexion 5/4 (pain limited), shoulder abduction 5/5, biceps 5/4+, tricep 5/5. Grip Strength R 63.6, L 48.3. LE R/L hip flexion 5/4- (onset of sciatica pain), knee ext 5/4 (onset of sciatica pain), knee flexion 5/5, dorsiflexion 5/4 (onset of sciatica pain), plantarflexion 5/4+ (onset of sciatica pain). Palpation: pt with TTP T2-L3 CPA with mild-moderate muscle flinch/spasm. TTP R UPA L1-L3. Generalized hypomobility of lumbar spine, moderate-severe hypomobility at L4-5, sacrum. Special Test: Slump Test R (-), L (+).    Rehab Potential Fair   Clinical Impairments Affecting Rehab Potential Negative: Chronic hx of LBP, cervical pain.   PT Frequency 2x / week  1-2x per pt   PT Duration 4 weeks   PT Treatment/Interventions ADLs/Self Care Home Management;Biofeedback;Aquatic Therapy;Cryotherapy;Electrical Stimulation;Moist Heat;Stair training;Functional mobility training;Therapeutic activities;Balance training;Therapeutic exercise;Neuromuscular re-education;Patient/family education;Manual techniques;Passive range of motion;Dry needling   PT Next Visit Plan manual  stretches, passive accesssory/glide assessment of cervical spine, spurlings, postural stability   PT Home Exercise Plan see patient instructions   Consulted and Agree with Plan of Care Patient      Patient will benefit from skilled therapeutic intervention in order to improve the following deficits and impairments:  Decreased activity tolerance, Decreased mobility, Decreased range of motion, Decreased strength, Difficulty walking, Hypomobility, Increased muscle spasms, Impaired flexibility, Impaired UE functional use, Improper body mechanics, Postural dysfunction, Pain  Visit Diagnosis: Abnormal posture  Bilateral low back pain with left-sided sciatica  Muscle spasm of back  Cervicalgia     Problem List Patient Active Problem List   Diagnosis Date Noted  . Back pain of lumbar region with sciatica 04/29/2016  . Cervical disc disorder with radiculopathy 04/29/2016  . Emphysema of lung (HCC) 12/09/2015  . Solitary pulmonary nodule 12/09/2015  . Hyperlipidemia 05/21/2015  . Compulsive tobacco user syndrome 05/21/2015  . Muscle spasms of neck 05/21/2015  . Gastroesophageal reflux disease without esophagitis 05/21/2015  . Essential hypertension 05/21/2015   Cammie Mcgee, PT, DPT # [phone] Michaelyn Barter, SPT 05/06/2016, 9:15 AM  [facility] [address] [place], [place], [address] Phone: [phone]   Fax:  [phone]  Name: Anthony Roland MRN: [id number] Date of Birth: 10/01/1965

## 2016-05-13 ENCOUNTER — Encounter: Payer: Self-pay | Admitting: Physical Therapy

## 2016-05-13 ENCOUNTER — Ambulatory Visit: Payer: BLUE CROSS/BLUE SHIELD | Admitting: Physical Therapy

## 2016-05-13 DIAGNOSIS — R293 Abnormal posture: Secondary | ICD-10-CM

## 2016-05-13 DIAGNOSIS — M542 Cervicalgia: Secondary | ICD-10-CM

## 2016-05-13 DIAGNOSIS — M5442 Lumbago with sciatica, left side: Secondary | ICD-10-CM

## 2016-05-13 DIAGNOSIS — M62830 Muscle spasm of back: Secondary | ICD-10-CM

## 2016-05-13 NOTE — Therapy (Signed)
[facility] [address] [place], [place], [address] Phone: [phone]   Fax:  [phone]  Physical Therapy Treatment  Patient Details  Name: Teresa Gutierrez MRN: [id number] Date of Birth: 06/14/1966 Referring Provider: Glean Hess MD  Encounter Date: 05/13/2016      PT End of Session - 05/13/16 1224    Visit Number 2   Number of Visits 8   Date for PT Re-Evaluation 06/03/16   PT Start Time 0930   PT Stop Time 1020   PT Time Calculation (min) 50 min   Activity Tolerance Patient tolerated treatment well;Patient limited by pain   Behavior During Therapy [facility] for tasks assessed/performed      History reviewed. No pertinent past medical history.  Past Surgical History:  Procedure Laterality Date  . ABDOMINAL HYSTERECTOMY  2014   ovaries remain    There were no vitals filed for this visit.      Subjective Assessment - 05/13/16 1221    Subjective Pt states that she feels her exercises have helped her immensely, she notes that she no longer has neck pain and feels that her back pain has decreased significantly. Pt would like to continue coming to physical therapy to progress through LE/spine mobility/strengthening program.   Pertinent History 20 year hx of chronic low back and neck pain. Sx exacerbated 2 weeks ago with pt reporting near constant pain in low back/neck.   Limitations Sitting;Lifting;Standing;Walking;House hold activities   How long can you walk comfortably? < 15 minutes   Diagnostic tests X-ray Lumbar Spine: Degenerative changes at L5-S1 with 15m of anterolisthesis at L5-S1. X-ray Cervical Spine: Degenerative disc disease at C4-5, C5-6, C6-7.   Patient Stated Goals pt would like to not be in constant back/neck pain so she can carry out ADLs and begin exercise program   Currently in Pain? Yes   Pain Score 1    Pain Location Back   Pain Orientation Lower   Pain Descriptors / Indicators Constant   Pain  Type Chronic pain   Pain Onset More than a month ago   Pain Frequency Constant      Objective:   Therapeutic Exercise: Standing quadriceps stretch 1 minute x2. Supine/standing hip flexor stretch 1 minute x2. TrA activation 10 sec holds x10. Pelvic tilt with TrA activation x10. Pelvic tilt, TrA activation with heel slide x10. SLR x10 L, x10 #2.5, x 10 #2.5 RLE. Clamshell 3x10 RLE; pt unable to tolerate R sidelying position to perform LLE secondary to acute hip pain. Bridge 3x10 with 3 sec holds; pt with notable difficulty performing.   Manual tx: Hamstring MET 5 sec holds, 1 minute end range stretch x2 R/L.    Pt response for medical necessity: Pt demonstrates good tolerance of all stretching and LE strengthening exercises but experiences acute R sided hip pain when in R sidelying following clamshell exercise. Pain abates with change in position but pt unable to tolerate R sidelying for remainder of session.      PT Education - 05/13/16 1224    Education provided Yes   Education Details See patient instructions: hip flexor/quad stretch; LE strengthening including SLR, bridge and clamshell   Person(s) Educated Patient   Methods Explanation;Demonstration;Handout   Comprehension Verbalized understanding;Returned demonstration             PT Long Term Goals - 05/06/16 0906      PT LONG TERM GOAL #1   Title Pt will complete MODI, NDI, LEFS, FABQ  to determine functional mobility and fear avoidance as it pertains to LBP/cervical pain to promote objective measure of pt progress   Baseline outcome measures not completed   Time 1   Period Weeks   Status New     PT LONG TERM GOAL #2   Title Pt will report decrease of constant pain level in neck and low back/L leg to <3/10 while performing ADLs to promote return to PLOF   Baseline pt states she is "taking the week off" secondary to 5/10 constant LBP/sciatica/neck pain with acute excaerbations    Time 4   Period Weeks   Status New      PT LONG TERM GOAL #3   Title Pt will demonstrate equivalent RLE/LLE strength per MMT to promote decreased aggravation of sciatica pain and improvement of functional mobility   Baseline LE R/L hip flexion 5/4- (onset of sciatica pain), knee ext 5/4 (onset of sciatica pain), knee flexion 5/5, dorsiflexion 5/4 (onset of sciatica pain), plantarflexion 5/4+ (onset of sciatica pain).    Time 4   Period Weeks   Status New     PT LONG TERM GOAL #4   Title Pt will demonstrate cervical ROM to 40 deg cervical flexion, 50 deg R/L rotation, 30 deg R/L lateral flexion to promote functional ROM so pt is safe with driving/tasks that require quick head movements   Baseline Cervical flexion 36 deg with inclinometer, Ext WNL, R rot 42 deg, L rot 45 deg, R lateral flexion 22, L lateral flexion 17   Time 4   Period Weeks   Status New               Plan - 05/13/16 1225    Clinical Impression Statement Pt demonstrates improvement in tolerable hamstring stretch bilaterally; marked tightness of bilateral quadriceps. She tolerates LE strengthening program this session including SLR with no c/o of sciatica/LBP but with more difficulty when performed on LLE than RLE. Pt experiences acute R sided hip pain following clamshells targeting L hip; pain resolved with change in position.    Rehab Potential Fair   Clinical Impairments Affecting Rehab Potential Negative: Chronic hx of LBP, cervical pain.   PT Frequency 2x / week   PT Duration 4 weeks   PT Treatment/Interventions ADLs/Self Care Home Management;Biofeedback;Aquatic Therapy;Cryotherapy;Electrical Stimulation;Moist Heat;Stair training;Functional mobility training;Therapeutic activities;Balance training;Therapeutic exercise;Neuromuscular re-education;Patient/family education;Manual techniques;Passive range of motion;Dry needling   PT Next Visit Plan manual stretches, passive accesssory/glide assessment of cervical spine, spurlings, postural stability   PT Home  Exercise Plan see patient instructions   Consulted and Agree with Plan of Care Patient      Patient will benefit from skilled therapeutic intervention in order to improve the following deficits and impairments:  Decreased activity tolerance, Decreased mobility, Decreased range of motion, Decreased strength, Difficulty walking, Hypomobility, Increased muscle spasms, Impaired flexibility, Impaired UE functional use, Improper body mechanics, Postural dysfunction, Pain  Visit Diagnosis: Abnormal posture  Bilateral low back pain with left-sided sciatica  Muscle spasm of back  Cervicalgia     Problem List Patient Active Problem List   Diagnosis Date Noted  . Back pain of lumbar region with sciatica 04/29/2016  . Cervical disc disorder with radiculopathy 04/29/2016  . Emphysema of lung ([place]) 12/09/2015  . Solitary pulmonary nodule 12/09/2015  . Hyperlipidemia 05/21/2015  . Compulsive tobacco user syndrome 05/21/2015  . Muscle spasms of neck 05/21/2015  . Gastroesophageal reflux disease without esophagitis 05/21/2015  . Essential hypertension 05/21/2015   Pura Spice, PT,  DPT # [id number] Derrill Memo, SPT 05/13/2016, 12:40 PM  [facility] [address] [place], [place], [address] Phone: [phone]   Fax:  [phone]  Name: Teresa Gutierrez MRN: [id number] Date of Birth: Jan 18, 1966

## 2016-05-14 NOTE — Telephone Encounter (Signed)
Must get PA in November

## 2016-05-20 ENCOUNTER — Encounter: Payer: Self-pay | Admitting: Physical Therapy

## 2016-05-20 ENCOUNTER — Ambulatory Visit: Payer: BLUE CROSS/BLUE SHIELD | Admitting: Physical Therapy

## 2016-05-20 DIAGNOSIS — M5442 Lumbago with sciatica, left side: Secondary | ICD-10-CM

## 2016-05-20 DIAGNOSIS — M542 Cervicalgia: Secondary | ICD-10-CM

## 2016-05-20 DIAGNOSIS — R293 Abnormal posture: Secondary | ICD-10-CM

## 2016-05-20 DIAGNOSIS — M62830 Muscle spasm of back: Secondary | ICD-10-CM

## 2016-05-20 NOTE — Therapy (Signed)
[place] [facility] [address] [place], [place], [address]  Phone: [phone]   Fax:  [phone]  Physical Therapy Treatment  Patient Details  Name: Teresa Gutierrez MRN: [id number] Date of Birth: 1966-07-20 Referring Provider: Reubin MilanLaura H Berglund MD  Encounter Date: 05/20/2016      PT End of Session - 05/20/16 1128    Visit Number 3   Number of Visits 8   Date for PT Re-Evaluation 06/03/16   PT Start Time 0900   PT Stop Time 0946   PT Time Calculation (min) 46 min   Activity Tolerance Patient tolerated treatment well   Behavior [facility] for tasks assessed/performed      History reviewed. No pertinent past medical history.  Past Surgical History:  Procedure Laterality Date  . ABDOMINAL HYSTERECTOMY  2014   ovaries remain    There were no vitals filed for this visit.      Subjective Assessment - 05/20/16 1118    Subjective Pt states that she has continued to feel better over the last week. States that her back bothered her after a period of housekeeping duties (vacuuming/leaning over the counter for meal prep). Denies issues with her neck over the last week.   Pertinent History 20 year hx of chronic low back and neck pain. Sx exacerbated 2 weeks ago with pt reporting near constant pain in low back/neck.   Limitations Sitting;Lifting;Standing;Walking;House hold activities   How long can you walk comfortably? < 15 minutes   Diagnostic tests X-ray Lumbar Spine: Degenerative changes at L5-S1 with 4mm of anterolisthesis at L5-S1. X-ray Cervical Spine: Degenerative disc disease at C4-5, C5-6, C6-7.   Patient Stated Goals pt would like to not be in constant back/neck pain so she can carry out ADLs and begin exercise program   Currently in Pain? No/denies      Objective:  Therapeutic Exercise: Treadmill 1.5mph 6 minutes (warm up/no charge). TrA activation drills: TrA activation 10 sec x5 with emphasis on pelvic tilt. Heel  slide x10 with core activation. Marching x10 with core activation. Beginning bike 1 minute x2. Bridge with marching 15 sec x5. Bridge with knee extension 15 sec x5. Advanced bike 15 sec x2.  TG squat x30, x30 w/ shoulder flexion #2 x30, x30 w/ press up #2, x30 with YTB at proximal knee for hip abd, x30 bicep curl, single leg x30 R/L ea. Lumbar rotation with white theraball x30. Bridge with white theraball 2x15.  Pt response for medical necessity: Pt with no c/o of LBP/neck pain this session. Pt with difficulty performing TrA activation with instrinsic cueing; better with extrinsic cueing for pelvic tilt. Pt with moderate crepitus in bilateral knee during single leg squat on TG but with no pain.      PT Education - 05/20/16 1126    Education provided Yes   Education Details TrA activation drills up to advanced bike.   Person(s) Educated Patient   Methods Demonstration;Explanation;Handout   Comprehension Verbalized understanding;Returned demonstration             PT Long Term Goals - 05/06/16 0906      PT LONG TERM GOAL #1   Title Pt will complete MODI, NDI, LEFS, FABQ to determine functional mobility and fear avoidance as it pertains to LBP/cervical pain to promote objective measure of pt progress   Baseline outcome measures not completed   Time 1   Period Weeks   Status New     PT LONG TERM GOAL #  2   Title Pt will report decrease of constant pain level in neck and low back/L leg to <3/10 while performing ADLs to promote return to PLOF   Baseline pt states she is "taking the week off" secondary to 5/10 constant LBP/sciatica/neck pain with acute excaerbations    Time 4   Period Weeks   Status New     PT LONG TERM GOAL #3   Title Pt will demonstrate equivalent RLE/LLE strength per MMT to promote decreased aggravation of sciatica pain and improvement of functional mobility   Baseline LE R/L hip flexion 5/4- (onset of sciatica pain), knee ext 5/4 (onset of sciatica pain), knee  flexion 5/5, dorsiflexion 5/4 (onset of sciatica pain), plantarflexion 5/4+ (onset of sciatica pain).    Time 4   Period Weeks   Status New     PT LONG TERM GOAL #4   Title Pt will demonstrate cervical ROM to 40 deg cervical flexion, 50 deg R/L rotation, 30 deg R/L lateral flexion to promote functional ROM so pt is safe with driving/tasks that require quick head movements   Baseline Cervical flexion 36 deg with inclinometer, Ext WNL, R rot 42 deg, L rot 45 deg, R lateral flexion 22, L lateral flexion 17   Time 4   Period Weeks   Status New            Plan - 05/20/16 1129    Clinical Impression Statement Pt with difficulty following intrinsic cueing for TrA activation secondary to localized numbness in abdominals/pelvic floor secondary to hysterectomy 3 years prior. Better response with extrinsic cueing and able to complete several progressive core exercises while maintaining posterior pelvic tilt with minimal cueing for breath holding. Pt with moderate crepitius in both knees noted during single leg squat on TG but with no c/o of knee pain.   Rehab Potential Fair   Clinical Impairments Affecting Rehab Potential Negative: Chronic hx of LBP, cervical pain.   PT Frequency 2x / week   PT Duration 4 weeks   PT Treatment/Interventions ADLs/Self Care Home Management;Biofeedback;Aquatic Therapy;Cryotherapy;Electrical Stimulation;Moist Heat;Stair training;Functional mobility training;Therapeutic activities;Balance training;Therapeutic exercise;Neuromuscular re-education;Patient/family education;Manual techniques;Passive range of motion;Dry needling   PT Next Visit Plan progressive strengthening exercise/TrA   PT Home Exercise Plan see patient instructions   Consulted and Agree with Plan of Care Patient      Patient will benefit from skilled therapeutic intervention in order to improve the following deficits and impairments:  Decreased activity tolerance, Decreased mobility, Decreased range of  motion, Decreased strength, Difficulty walking, Hypomobility, Increased muscle spasms, Impaired flexibility, Impaired UE functional use, Improper body mechanics, Postural dysfunction, Pain  Visit Diagnosis: Abnormal posture  Bilateral low back pain with left-sided sciatica  Muscle spasm of back  Cervicalgia     Problem List Patient Active Problem List   Diagnosis Date Noted  . Back pain of lumbar region with sciatica 04/29/2016  . Cervical disc disorder with radiculopathy 04/29/2016  . Emphysema of lung ([facility]) 12/09/2015  . Solitary pulmonary nodule 12/09/2015  . Hyperlipidemia 05/21/2015  . Compulsive tobacco user syndrome 05/21/2015  . Muscle spasms of neck 05/21/2015  . Gastroesophageal reflux disease without esophagitis 05/21/2015  . Essential hypertension 05/21/2015   Cammie Mcgee, PT, DPT # [id number] Michaelyn Barter, SPT 05/21/2016, 8:53 AM  [place] [facility] [address] [place], [place], [address] Phone: [phone]   Fax:  [phone]  Name: Teresa Gutierrez MRN: [id number] Date of Birth: October 19, 1965

## 2016-05-27 ENCOUNTER — Encounter: Payer: Self-pay | Admitting: Physical Therapy

## 2016-05-27 ENCOUNTER — Ambulatory Visit: Payer: BLUE CROSS/BLUE SHIELD | Attending: Internal Medicine | Admitting: Physical Therapy

## 2016-05-27 DIAGNOSIS — R293 Abnormal posture: Secondary | ICD-10-CM | POA: Diagnosis not present

## 2016-05-27 DIAGNOSIS — M5442 Lumbago with sciatica, left side: Secondary | ICD-10-CM | POA: Insufficient documentation

## 2016-05-27 DIAGNOSIS — M542 Cervicalgia: Secondary | ICD-10-CM | POA: Diagnosis present

## 2016-05-27 DIAGNOSIS — M62830 Muscle spasm of back: Secondary | ICD-10-CM | POA: Insufficient documentation

## 2016-05-27 DIAGNOSIS — G8929 Other chronic pain: Secondary | ICD-10-CM | POA: Insufficient documentation

## 2016-05-27 NOTE — Therapy (Signed)
[place] [facility] [address] [place], [place], [address] Phone: [phone]   Fax:  [phone]  Physical Therapy Treatment/Discharge  Patient Details  Name: Teresa Gutierrez MRN: [id number] Date of Birth: 1965-09-30 Referring Provider: Glean Hess MD  Encounter Date: 05/27/2016      PT End of Session - 05/27/16 1320    Visit Number 4   Number of Visits 8   Date for PT Re-Evaluation 06/03/16   PT Start Time 0851   PT Stop Time 0947   PT Time Calculation (min) 56 min   Activity Tolerance Patient tolerated treatment well   Behavior During Therapy [facility] for tasks assessed/performed      History reviewed. No pertinent past medical history.  Past Surgical History:  Procedure Laterality Date  . ABDOMINAL HYSTERECTOMY  2014   ovaries remain    There were no vitals filed for this visit.      Subjective Assessment - 05/27/16 0900    Subjective Pt states that she has had no issues with back or neck since last visit. States that she vacuumed and felt a small amount of pain in her lower back but she did her stretches and the pain went away.   Pertinent History 20 year hx of chronic low back and neck pain. Sx exacerbated 2 weeks ago with pt reporting near constant pain in low back/neck.   Limitations Sitting;Lifting;Standing;Walking;House hold activities   How long can you walk comfortably? --   Diagnostic tests X-ray Lumbar Spine: Degenerative changes at L5-S1 with 52m of anterolisthesis at L5-S1. X-ray Cervical Spine: Degenerative disc disease at C4-5, C5-6, C6-7.   Patient Stated Goals pt would like to not be in constant back/neck pain so she can carry out ADLs and begin exercise program   Currently in Pain? No/denies      Objective:  Therapeutic Exercise: SciFit Level 6 10 minutes (warm up/no charge). Bridge in hooklying with YTB at proximal knee x20 with TrA contraction. Hooklying hip abd with YTB x10 bilat. Standing isometric  hip abduction 10 sec holds x10 RLE/LLE. Standing isometric hip extension 10 sec holds x10 RLE/LLE. Wall slide x30. Squat with chair + air ex pad for tactile cueing; emphasis on hip hinge. Lateral step with mini squat [id number].   Pt response for medical necessity: Pt demonstrates good tolerance of all issued exercises this session. She experiences regular crepitus in bilateral knees but with no c/o of pain. Pt has long hx of bilateral hip pain and shows better tolerance of standing hip exercises than previously issued sidelying there ex. She notices mild discomfort in low back when maintaining mini squat for lateral step activity but is able to perform isometric hip abd with no issues and will implement that latter activity into her HEP program. Pt will begin independent management of LBP/neck pain at this time.       PT Education - 05/27/16 1320    Education provided Yes   Education Details HEP to include advanced LE strengthening tasks.   Person(s) Educated Patient   Methods Explanation;Demonstration;Handout   Comprehension Verbalized understanding;Returned demonstration             PT Long Term Goals - 05/27/16 1324      PT LONG TERM GOAL #1   Title Pt will complete MODI, NDI, LEFS, FABQ to determine functional mobility and fear avoidance as it pertains to LBP/cervical pain to promote objective measure of pt progress   Baseline NDI: 4%, LEFS 64/80,  MODI: 8%   Time 1   Period Weeks   Status Achieved     PT LONG TERM GOAL #2   Title Pt will report decrease of constant pain level in neck and low back/L leg to <3/10 while performing ADLs to promote return to PLOF   Baseline pt states she is "taking the week off" secondary to 5/10 constant LBP/sciatica/neck pain with acute excaerbations    Time 4   Period Weeks   Status Achieved     PT LONG TERM GOAL #3   Title Pt will demonstrate equivalent RLE/LLE strength per MMT to promote decreased aggravation of sciatica pain and improvement of  functional mobility   Baseline LE R/L hip flexion 5/4- (onset of sciatica pain), knee ext 5/4 (onset of sciatica pain), knee flexion 5/5, dorsiflexion 5/4 (onset of sciatica pain), plantarflexion 5/4+ (onset of sciatica pain).    Time 4   Period Weeks   Status Partially Met     PT LONG TERM GOAL #4   Title Pt will demonstrate cervical ROM to 40 deg cervical flexion, 50 deg R/L rotation, 30 deg R/L lateral flexion to promote functional ROM so pt is safe with driving/tasks that require quick head movements   Baseline Cervical flexion 36 deg with inclinometer, Ext WNL, R rot 42 deg, L rot 45 deg, R lateral flexion 22, L lateral flexion 17   Time 4   Period Weeks   Status Achieved           Plan - 05/27/16 1321    Clinical Impression Statement Pt has demonstrated progressive improvement in low back and neck pain since initial physical therapy visit. She is no longer limited by pain and has been able to resume regular household duties. Pt shows good understanding of current HEP program and issued progressive strengthening program. She will begin independent management of her sx at this time and follow up with PT as needed.   Rehab Potential Fair   Clinical Impairments Affecting Rehab Potential Negative: Chronic hx of LBP, cervical pain.   PT Frequency 2x / week   PT Duration 4 weeks   PT Treatment/Interventions ADLs/Self Care Home Management;Biofeedback;Aquatic Therapy;Cryotherapy;Electrical Stimulation;Moist Heat;Stair training;Functional mobility training;Therapeutic activities;Balance training;Therapeutic exercise;Neuromuscular re-education;Patient/family education;Manual techniques;Passive range of motion;Dry needling   PT Home Exercise Plan see patient instructions   Consulted and Agree with Plan of Care Patient      Patient will benefit from skilled therapeutic intervention in order to improve the following deficits and impairments:  Decreased activity tolerance, Decreased mobility,  Decreased range of motion, Decreased strength, Difficulty walking, Hypomobility, Increased muscle spasms, Impaired flexibility, Impaired UE functional use, Improper body mechanics, Postural dysfunction, Pain  Visit Diagnosis: Abnormal posture  Chronic bilateral low back pain with left-sided sciatica  Muscle spasm of back  Cervicalgia     Problem List Patient Active Problem List   Diagnosis Date Noted  . Back pain of lumbar region with sciatica 04/29/2016  . Cervical disc disorder with radiculopathy 04/29/2016  . Emphysema of lung ([facility]) 12/09/2015  . Solitary pulmonary nodule 12/09/2015  . Hyperlipidemia 05/21/2015  . Compulsive tobacco user syndrome 05/21/2015  . Muscle spasms of neck 05/21/2015  . Gastroesophageal reflux disease without esophagitis 05/21/2015  . Essential hypertension 05/21/2015   Pura Spice, PT, DPT # [phone] 05/27/2016, 1:27 PM  [facility] [address] [place], [place], [address] Phone: [phone]   Fax:  [phone]  Name: Akiyah Eppolito MRN: [id number] Date of  Birth: Aug 03, 1966

## 2016-07-20 ENCOUNTER — Other Ambulatory Visit: Payer: Self-pay | Admitting: Internal Medicine

## 2016-07-20 MED ORDER — IRBESARTAN 300 MG PO TABS: 300.0000 mg | ORAL_TABLET | Freq: Every day | ORAL | 3 refills | Status: DC

## 2016-08-05 ENCOUNTER — Ambulatory Visit: Payer: BLUE CROSS/BLUE SHIELD

## 2016-08-31 ENCOUNTER — Encounter: Payer: Self-pay | Admitting: Internal Medicine

## 2016-08-31 ENCOUNTER — Ambulatory Visit (INDEPENDENT_AMBULATORY_CARE_PROVIDER_SITE_OTHER): Payer: BLUE CROSS/BLUE SHIELD | Admitting: Internal Medicine

## 2016-08-31 VITALS — BP 118/84 | HR 88 | Temp 97.6°F | Ht 67.0 in | Wt 161.0 lb

## 2016-08-31 DIAGNOSIS — Z0001 Encounter for general adult medical examination with abnormal findings: Secondary | ICD-10-CM | POA: Diagnosis not present

## 2016-08-31 DIAGNOSIS — J439 Emphysema, unspecified: Secondary | ICD-10-CM | POA: Diagnosis not present

## 2016-08-31 DIAGNOSIS — Z1211 Encounter for screening for malignant neoplasm of colon: Secondary | ICD-10-CM

## 2016-08-31 DIAGNOSIS — Z0000 Encounter for general adult medical examination without abnormal findings: Secondary | ICD-10-CM

## 2016-08-31 DIAGNOSIS — R911 Solitary pulmonary nodule: Secondary | ICD-10-CM

## 2016-08-31 DIAGNOSIS — I10 Essential (primary) hypertension: Secondary | ICD-10-CM

## 2016-08-31 DIAGNOSIS — Z1231 Encounter for screening mammogram for malignant neoplasm of breast: Secondary | ICD-10-CM

## 2016-08-31 DIAGNOSIS — M5010 Cervical disc disorder with radiculopathy, unspecified cervical region: Secondary | ICD-10-CM

## 2016-08-31 DIAGNOSIS — E782 Mixed hyperlipidemia: Secondary | ICD-10-CM | POA: Diagnosis not present

## 2016-08-31 DIAGNOSIS — K219 Gastro-esophageal reflux disease without esophagitis: Secondary | ICD-10-CM

## 2016-08-31 DIAGNOSIS — Z1239 Encounter for other screening for malignant neoplasm of breast: Secondary | ICD-10-CM

## 2016-08-31 LAB — POCT URINALYSIS DIPSTICK
Bilirubin, UA: NEGATIVE
Blood, UA: NEGATIVE
Glucose, UA: NEGATIVE
Ketones, UA: NEGATIVE
Leukocytes, UA: NEGATIVE
Nitrite, UA: NEGATIVE
Spec Grav, UA: <=1.005
Urobilinogen, UA: 0.2
pH, UA: 5

## 2016-08-31 MED ORDER — TRAMADOL HCL 50 MG PO TABS: 50.0000 mg | ORAL_TABLET | Freq: Three times a day (TID) | ORAL | 0 refills | Status: DC | PRN

## 2016-08-31 NOTE — Patient Instructions (Signed)
Breast Self-Awareness Introduction Breast self-awareness means being familiar with how your breasts look and feel. It involves checking your breasts regularly and reporting any changes to your health care provider. Practicing breast self-awareness is important. A change in your breasts can be a sign of a serious medical problem. Being familiar with how your breasts look and feel allows you to find any problems early, when treatment is more likely to be successful. All women should practice breast self-awareness, including women who have had breast implants. How to do a breast self-exam One way to learn what is normal for your breasts and whether your breasts are changing is to do a breast self-exam. To do a breast self-exam: Look for Changes  1. Remove all the clothing above your waist. 2. Stand in front of a mirror in a room with good lighting. 3. Put your hands on your hips. 4. Push your hands firmly downward. 5. Compare your breasts in the mirror. Look for differences between them (asymmetry), such as:  Differences in shape.  Differences in size.  Puckers, dips, and bumps in one breast and not the other. 6. Look at each breast for changes in your skin, such as:  Redness.  Scaly areas. 7. Look for changes in your nipples, such as:  Discharge.  Bleeding.  Dimpling.  Redness.  A change in position. Feel for Changes  Carefully feel your breasts for lumps and changes. It is best to do this while lying on your back on the floor and again while sitting or standing in the shower or tub with soapy water on your skin. Feel each breast in the following way:  Place the arm on the side of the breast you are examining above your head.  Feel your breast with the other hand.  Start in the nipple area and make  inch (2 cm) overlapping circles to feel your breast. Use the pads of your three middle fingers to do this. Apply light pressure, then medium pressure, then firm pressure. The light  pressure will allow you to feel the tissue closest to the skin. The medium pressure will allow you to feel the tissue that is a little deeper. The firm pressure will allow you to feel the tissue close to the ribs.  Continue the overlapping circles, moving downward over the breast until you feel your ribs below your breast.  Move one finger-width toward the center of the body. Continue to use the  inch (2 cm) overlapping circles to feel your breast as you move slowly up toward your collarbone.  Continue the up and down exam using all three pressures until you reach your armpit. Write Down What You Find  Write down what is normal for each breast and any changes that you find. Keep a written record with breast changes or normal findings for each breast. By writing this information down, you do not need to depend only on memory for size, tenderness, or location. Write down where you are in your menstrual cycle, if you are still menstruating. If you are having trouble noticing differences in your breasts, do not get discouraged. With time you will become more familiar with the variations in your breasts and more comfortable with the exam. How often should I examine my breasts? Examine your breasts every month. If you are breastfeeding, the best time to examine your breasts is after a feeding or after using a breast pump. If you menstruate, the best time to examine your breasts is 5-7 days after your   period is over. During your period, your breasts are lumpier, and it may be more difficult to notice changes. When should I see my health care provider? See your health care provider if you notice:  A change in shape or size of your breasts or nipples.  A change in the skin of your breast or nipples, such as a reddened or scaly area.  Unusual discharge from your nipples.  A lump or thick area that was not there before.  Pain in your breasts.  Anything that concerns you. This information is not  intended to replace advice given to you by your health care provider. Make sure you discuss any questions you have with your health care provider. Document Released: 08/10/2005 Document Revised: 01/16/2016 Document Reviewed: 06/30/2015  2017 [employer]  

## 2016-08-31 NOTE — Progress Notes (Signed)
Date:  08/31/2016   Name:  Teresa Gutierrez   DOB:  1966/06/11   MRN:  [id number]   Chief Complaint: Annual Exam Teresa Gutierrez is a 51 y.o. female who presents today for her Complete Annual Exam. She feels well. She reports exercising none. She reports she is sleeping well.   n   Hypertension  This is a chronic problem. The problem is controlled. Associated symptoms include neck pain. Pertinent negatives include no chest pain, headaches, palpitations or shortness of breath. Past treatments include angiotensin blockers. The current treatment provides significant improvement. There are no compliance problems.   Gastroesophageal Reflux  She reports no abdominal pain, no chest pain, no coughing or no wheezing. The problem occurs rarely. The symptoms are aggravated by certain foods. Pertinent negatives include no fatigue. She has tried a histamine-2 antagonist for the symptoms. The treatment provided significant relief.  Neck Pain   This is a chronic problem. The problem occurs daily. The problem has been gradually improving. The pain is associated with nothing. Pertinent negatives include no chest pain, fever, headaches or trouble swallowing. She has tried home exercises, muscle relaxants and NSAIDs (and physical therapy.  Takes 1/2 of her husbands vicodin about twice a week when pain is severe) for the symptoms. The treatment provided moderate relief.   Pulmonary nodule - had follow up CT scheduled in December but did not go to appointment due to cost issues.  She wants to explore other options.     Review of Systems  Constitutional: Negative for chills, fatigue and fever.  HENT: Negative for congestion, hearing loss, tinnitus, trouble swallowing and voice change.   Eyes: Negative for visual disturbance.  Respiratory: Negative for cough, chest tightness, shortness of breath and wheezing.   Cardiovascular: Negative for chest pain, palpitations and leg swelling.  Gastrointestinal:  Negative for abdominal pain, constipation, diarrhea and vomiting.  Endocrine: Negative for polydipsia and polyuria.  Genitourinary: Negative for dysuria, frequency, genital sores, vaginal bleeding and vaginal discharge.  Musculoskeletal: Positive for neck pain. Negative for arthralgias, gait problem and joint swelling.  Skin: Negative for color change and rash.  Neurological: Negative for dizziness, tremors, light-headedness and headaches.  Hematological: Negative for adenopathy. Does not bruise/bleed easily.  Psychiatric/Behavioral: Negative for dysphoric mood and sleep disturbance. The patient is not nervous/anxious.     Patient Active Problem List   Diagnosis Date Noted  . Back pain of lumbar region with sciatica 04/29/2016  . Cervical disc disorder with radiculopathy 04/29/2016  . Emphysema of lung (HCC) 12/09/2015  . Solitary pulmonary nodule 12/09/2015  . Hyperlipidemia 05/21/2015  . Compulsive tobacco user syndrome 05/21/2015  . Muscle spasms of neck 05/21/2015  . Gastroesophageal reflux disease without esophagitis 05/21/2015  . Essential hypertension 05/21/2015    Prior to Admission medications   Medication Sig Start Date End Date Taking? Authorizing Provider  Biotin 5000 MCG CAPS Take by mouth.   Yes Historical Provider, MD  Cholecalciferol (VITAMIN D-3) 1000 units CAPS Take by mouth.   Yes Historical Provider, MD  irbesartan (AVAPRO) 300 MG tablet Take 1 tablet (300 mg total) by mouth daily. 07/20/16  Yes Reubin MilanLaura H Nobie Alleyne, MD  ranitidine (ZANTAC) 150 MG capsule Take 1 capsule (150 mg total) by mouth every evening. 05/21/15  Yes Reubin MilanLaura H Malena Timpone, MD    No Known Allergies  Past Surgical History:  Procedure Laterality Date  . ABDOMINAL HYSTERECTOMY  2014   ovaries remain    Social History  Substance Use Topics  . Smoking  status: Current Every Day Smoker    Packs/day: 0.50    Years: 22.00    Types: Cigarettes  . Smokeless tobacco: Never Used     Comment: Recently  cut back to 2 cigs daily (12/09/15)  . Alcohol use 1.2 oz/week    2 Standard drinks or equivalent per week     Medication list has been reviewed and updated.   Physical Exam  Constitutional: She is oriented to person, place, and time. She appears well-developed and well-nourished. No distress.  HENT:  Head: Normocephalic and atraumatic.  Right Ear: Tympanic membrane and ear canal normal.  Left Ear: Tympanic membrane and ear canal normal.  Nose: Right sinus exhibits no maxillary sinus tenderness. Left sinus exhibits no maxillary sinus tenderness.  Mouth/Throat: Uvula is midline and oropharynx is clear and moist.  Eyes: Conjunctivae and EOM are normal. Right eye exhibits no discharge. Left eye exhibits no discharge. No scleral icterus.  Neck: Normal range of motion. Carotid bruit is not present. No erythema present. No thyromegaly present.  Cardiovascular: Normal rate, regular rhythm, normal heart sounds and normal pulses.   Pulmonary/Chest: Effort normal. No respiratory distress. She has no wheezes. Right breast exhibits no mass, no nipple discharge, no skin change and no tenderness. Left breast exhibits no mass, no nipple discharge, no skin change and no tenderness.  Abdominal: Soft. Bowel sounds are normal. There is no hepatosplenomegaly. There is no tenderness. There is no CVA tenderness.  Musculoskeletal: Normal range of motion.       Cervical back: She exhibits tenderness and spasm.  Lymphadenopathy:    She has no cervical adenopathy.    She has no axillary adenopathy.  Neurological: She is alert and oriented to person, place, and time. She has normal reflexes. No cranial nerve deficit or sensory deficit.  Skin: Skin is warm, dry and intact. No rash noted.  Psychiatric: She has a normal mood and affect. Her speech is normal and behavior is normal. Thought content normal.  Nursing note and vitals reviewed.   BP 118/84   Pulse 88   Temp 97.6 F (36.4 C)   Ht 5\' 7"  (1.702 m)    Wt 161 lb (73 kg)   SpO2 97%   BMI 25.22 kg/m   Assessment and Plan: 1. Annual physical exam Resume exercise - POCT urinalysis dipstick  2. Essential hypertension controlled - Comprehensive metabolic panel - TSH  3. Gastroesophageal reflux disease without esophagitis stable - CBC with Differential/Platelet  4. Pulmonary emphysema, unspecified emphysema type (HCC) Pt to research options for lower cost and call back  5. Mixed hyperlipidemia Check labs - Lipid panel  6. Colon cancer screening - Ambulatory referral to Gastroenterology  7. Breast cancer screening Done by [facility]  8. Solitary pulmonary nodule Needs follow up CT Encouraged to continue smoking cessation measures  9. Cervical disc disorder with radiculopathy Continue home exercises Try tramadol prn severe pain - traMADol (ULTRAM) 50 MG tablet; Take 1 tablet (50 mg total) by mouth every 8 (eight) hours as needed.  Dispense: 30 tablet; Refill: 0   Bari Edward, MD [facility]  08/31/2016

## 2016-09-01 LAB — COMPREHENSIVE METABOLIC PANEL
ALT: 15 IU/L (ref 0–32)
AST: 17 IU/L (ref 0–40)
Albumin/Globulin Ratio: 2 (ref 1.2–2.2)
Albumin: 4.9 g/dL (ref 3.5–5.5)
Alkaline Phosphatase: 125 IU/L — ABNORMAL HIGH (ref 39–117)
BUN/Creatinine Ratio: 18 (ref 9–23)
BUN: 12 mg/dL (ref 6–24)
Bilirubin Total: 0.5 mg/dL (ref 0.0–1.2)
CO2: 22 mmol/L (ref 18–29)
Calcium: 9.9 mg/dL (ref 8.7–10.2)
Chloride: 97 mmol/L (ref 96–106)
Creatinine, Ser: 0.65 mg/dL (ref 0.57–1.00)
GFR calc Af Amer: 120 mL/min/{1.73_m2} (ref >59)
GFR calc non Af Amer: 104 mL/min/{1.73_m2} (ref >59)
Globulin, Total: 2.4 g/dL (ref 1.5–4.5)
Glucose: 104 mg/dL — ABNORMAL HIGH (ref 65–99)
Potassium: 5.1 mmol/L (ref 3.5–5.2)
Sodium: 139 mmol/L (ref 134–144)
Total Protein: 7.3 g/dL (ref 6.0–8.5)

## 2016-09-01 LAB — CBC WITH DIFFERENTIAL/PLATELET
Basophils Absolute: 0.1 10*3/uL (ref 0.0–0.2)
Basos: 1 %
EOS (ABSOLUTE): 0.3 10*3/uL (ref 0.0–0.4)
Eos: 3 %
Hematocrit: 43.1 % (ref 34.0–46.6)
Hemoglobin: 14.7 g/dL (ref 11.1–15.9)
Immature Grans (Abs): 0 10*3/uL (ref 0.0–0.1)
Immature Granulocytes: 0 %
Lymphocytes Absolute: 2.2 10*3/uL (ref 0.7–3.1)
Lymphs: 21 %
MCH: 31.5 pg (ref 26.6–33.0)
MCHC: 34.1 g/dL (ref 31.5–35.7)
MCV: 93 fL (ref 79–97)
Monocytes Absolute: 0.6 10*3/uL (ref 0.1–0.9)
Monocytes: 6 %
Neutrophils Absolute: 7.5 10*3/uL — ABNORMAL HIGH (ref 1.4–7.0)
Neutrophils: 69 %
Platelets: 314 10*3/uL (ref 150–379)
RBC: 4.66 x10E6/uL (ref 3.77–5.28)
RDW: 13.5 % (ref 12.3–15.4)
WBC: 10.7 10*3/uL (ref 3.4–10.8)

## 2016-09-01 LAB — LIPID PANEL
Chol/HDL Ratio: 5.5 ratio units — ABNORMAL HIGH (ref 0.0–4.4)
Cholesterol, Total: 240 mg/dL — ABNORMAL HIGH (ref 100–199)
HDL: 44 mg/dL (ref >39)
LDL Calculated: 149 mg/dL — ABNORMAL HIGH (ref 0–99)
Triglycerides: 237 mg/dL — ABNORMAL HIGH (ref 0–149)
VLDL Cholesterol Cal: 47 mg/dL — ABNORMAL HIGH (ref 5–40)

## 2016-09-01 LAB — TSH: TSH: 2.66 u[IU]/mL (ref 0.450–4.500)

## 2016-11-19 ENCOUNTER — Other Ambulatory Visit: Payer: Self-pay

## 2016-11-19 ENCOUNTER — Telehealth: Payer: Self-pay | Admitting: Internal Medicine

## 2016-11-19 NOTE — Telephone Encounter (Signed)
The order was placed for the follow up CT back in 11/2015 and requested it for December.  Not sure why it wasn't done but the order is in the system.  Please schedule.

## 2016-11-19 NOTE — Telephone Encounter (Signed)
Pt called asking when she needed a CT done I had Delice Bisonara look in pt's chart and saw on dos 12/02/15 the note said sub nodule RUL 12 months Ev repeat would you like my to make an appt for patient to come in?

## 2016-12-01 ENCOUNTER — Ambulatory Visit
Admission: RE | Admit: 2016-12-01 | Discharge: 2016-12-01 | Disposition: A | Discharge status: Home / Self Care | Payer: BLUE CROSS/BLUE SHIELD | Source: Ambulatory Visit | Attending: Internal Medicine | Admitting: Internal Medicine

## 2016-12-01 DIAGNOSIS — N281 Cyst of kidney, acquired: Secondary | ICD-10-CM | POA: Diagnosis not present

## 2016-12-01 DIAGNOSIS — J439 Emphysema, unspecified: Secondary | ICD-10-CM | POA: Insufficient documentation

## 2016-12-01 DIAGNOSIS — R911 Solitary pulmonary nodule: Secondary | ICD-10-CM | POA: Diagnosis present

## 2017-04-20 ENCOUNTER — Ambulatory Visit (INDEPENDENT_AMBULATORY_CARE_PROVIDER_SITE_OTHER): Payer: 59 | Admitting: Internal Medicine

## 2017-04-20 ENCOUNTER — Encounter: Payer: Self-pay | Admitting: Internal Medicine

## 2017-04-20 VITALS — BP 118/80 | HR 92 | Ht 67.0 in | Wt 155.4 lb

## 2017-04-20 DIAGNOSIS — L309 Dermatitis, unspecified: Secondary | ICD-10-CM | POA: Diagnosis not present

## 2017-04-20 DIAGNOSIS — I10 Essential (primary) hypertension: Secondary | ICD-10-CM

## 2017-04-20 DIAGNOSIS — F411 Generalized anxiety disorder: Secondary | ICD-10-CM

## 2017-04-20 MED ORDER — TRIAMCINOLONE ACETONIDE 0.1 % EX CREA: 1.0000 "application " | TOPICAL_CREAM | Freq: Two times a day (BID) | CUTANEOUS | 0 refills | Status: DC

## 2017-04-20 MED ORDER — CITALOPRAM HYDROBROMIDE 10 MG PO TABS: 10.0000 mg | ORAL_TABLET | Freq: Every day | ORAL | 3 refills | Status: DC

## 2017-04-20 MED ORDER — IRBESARTAN 300 MG PO TABS: 300.0000 mg | ORAL_TABLET | Freq: Every day | ORAL | 3 refills | Status: DC

## 2017-04-20 NOTE — Patient Instructions (Signed)
Start Celexa 5 mg per day for one week (1/2 a tablet) then increase to one per day (10 mg).

## 2017-04-20 NOTE — Progress Notes (Signed)
Date:  04/20/2017   Name:  Teresa Gutierrez   DOB:  [birth year]   MRN:  [id number]   Chief Complaint: Hypertension and Anxiety (GAD 7: 13. Patient states her stress level is increased. ) Hypertension  This is a chronic problem. The problem is controlled. Associated symptoms include anxiety. Pertinent negatives include no chest pain, palpitations or shortness of breath. Past treatments include angiotensin blockers. The current treatment provides significant improvement.  Anxiety  Presents for initial visit. Onset was at an unknown time. The problem has been gradually worsening. Symptoms include excessive worry, irritability and nervous/anxious behavior. Patient reports no chest pain, palpitations, shortness of breath or suicidal ideas. Symptoms occur most days. The severity of symptoms is interfering with daily activities. The symptoms are aggravated by family issues (grown step son and step daughter moved in).    Rash  This is a recurrent problem. The affected locations include the left hand and right hand. Pertinent negatives include no fever or shortness of breath.   Review of Systems  Constitutional: Positive for irritability. Negative for chills and fever.  Respiratory: Negative for chest tightness, shortness of breath and wheezing.   Cardiovascular: Negative for chest pain and palpitations.  Skin: Positive for rash.  Psychiatric/Behavioral: Negative for suicidal ideas. The patient is nervous/anxious.     Patient Active Problem List   Diagnosis Date Noted  . Back pain of lumbar region with sciatica 04/29/2016  . Cervical disc disorder with radiculopathy 04/29/2016  . Emphysema of lung (HCC) 12/09/2015  . Solitary pulmonary nodule 12/09/2015  . Hyperlipidemia 05/21/2015  . Compulsive tobacco user syndrome 05/21/2015  . Muscle spasms of neck 05/21/2015  . Gastroesophageal reflux disease without esophagitis 05/21/2015  . Essential hypertension 05/21/2015    Prior to Admission  medications   Medication Sig Start Date End Date Taking? Authorizing Provider  Biotin 5000 MCG CAPS Take by mouth.   Yes [provider]  Cholecalciferol (VITAMIN D-3) 1000 units CAPS Take by mouth.   Yes [provider]  irbesartan (AVAPRO) 300 MG tablet Take 1 tablet (300 mg total) by mouth daily. 07/20/16  Yes Reubin Milan, MD  ranitidine (ZANTAC) 150 MG capsule Take 1 capsule (150 mg total) by mouth every evening. 05/21/15  Yes Reubin Milan, MD    No Known Allergies  Past Surgical History:  Procedure Laterality Date  . ABDOMINAL HYSTERECTOMY  2014   ovaries remain    Social History  Substance Use Topics  . Smoking status: Current Every Day Smoker    Packs/day: 0.50    Years: 22.00    Types: Cigarettes  . Smokeless tobacco: Never Used  . Alcohol use 1.2 oz/week    2 Standard drinks or equivalent per week   GAD 7 : Generalized Anxiety Score 04/20/2017  Nervous, Anxious, on Edge 2  Control/stop worrying 2  Worry too much - different things 2  Trouble relaxing 2  Restless 2  Easily annoyed or irritable 3  Afraid - awful might happen 0  Total GAD 7 Score 13  Anxiety Difficulty Extremely difficult    Medication list has been reviewed and updated.  PHQ 2/9 Scores 04/20/2017 02/27/2016  PHQ - 2 Score 0 0    Physical Exam  Constitutional: She is oriented to person, place, and time. She appears well-developed. No distress.  HENT:  Head: Normocephalic and atraumatic.  Neck: Normal range of motion. Neck supple. No thyromegaly present.  Cardiovascular: Normal rate, regular rhythm and normal heart sounds.  Pulmonary/Chest: Effort normal and breath sounds normal. No respiratory distress. She has no wheezes.  Musculoskeletal: Normal range of motion. She exhibits no edema or tenderness.  Neurological: She is alert and oriented to person, place, and time.  Skin: Skin is warm and dry. No rash noted.  Eczematous rash of fingers of both hands    Psychiatric: She has a normal mood and affect. Her speech is normal and behavior is normal. Thought content normal.  Nursing note and vitals reviewed.   BP 118/80   Pulse 92   Ht 5\' 7"  (1.702 m)   Wt 155 lb 6.4 oz (70.5 kg)   SpO2 99%   BMI 24.34 kg/m   Assessment and Plan: 1. Essential hypertension controlled - irbesartan (AVAPRO) 300 MG tablet; Take 1 tablet (300 mg total) by mouth daily.  Dispense: 90 tablet; Refill: 3  2. GAD (generalized anxiety disorder) Begin celexa 10 mg; consider increasing dose if needed - citalopram (CELEXA) 10 MG tablet; Take 1 tablet (10 mg total) by mouth daily.  Dispense: 30 tablet; Refill: 3  3. Eczema, unspecified type Wear cotton gloves under rubber gloves when cleaning And over TAC at night - triamcinolone cream (KENALOG) 0.1 %; Apply 1 application topically 2 (two) times daily.  Dispense: 30 g; Refill: 0   Meds ordered this encounter  Medications  . irbesartan (AVAPRO) 300 MG tablet    Sig: Take 1 tablet (300 mg total) by mouth daily.    Dispense:  90 tablet    Refill:  3  . citalopram (CELEXA) 10 MG tablet    Sig: Take 1 tablet (10 mg total) by mouth daily.    Dispense:  30 tablet    Refill:  3  . triamcinolone cream (KENALOG) 0.1 %    Sig: Apply 1 application topically 2 (two) times daily.    Dispense:  30 g    Refill:  0    Partially dictated using Animal nutritionist. Any errors are unintentional.  Bari Edward, MD [facility]  04/20/2017

## 2017-08-25 ENCOUNTER — Other Ambulatory Visit: Payer: Self-pay | Admitting: Internal Medicine

## 2017-08-25 DIAGNOSIS — F411 Generalized anxiety disorder: Secondary | ICD-10-CM

## 2017-09-01 ENCOUNTER — Encounter: Payer: Self-pay | Admitting: Internal Medicine

## 2017-09-01 ENCOUNTER — Ambulatory Visit (INDEPENDENT_AMBULATORY_CARE_PROVIDER_SITE_OTHER): Payer: 59 | Admitting: Internal Medicine

## 2017-09-01 VITALS — BP 124/80 | HR 78 | Ht 67.0 in | Wt 156.0 lb

## 2017-09-01 DIAGNOSIS — Z0000 Encounter for general adult medical examination without abnormal findings: Secondary | ICD-10-CM

## 2017-09-01 DIAGNOSIS — J439 Emphysema, unspecified: Secondary | ICD-10-CM | POA: Diagnosis not present

## 2017-09-01 DIAGNOSIS — E782 Mixed hyperlipidemia: Secondary | ICD-10-CM

## 2017-09-01 DIAGNOSIS — K219 Gastro-esophageal reflux disease without esophagitis: Secondary | ICD-10-CM

## 2017-09-01 DIAGNOSIS — F411 Generalized anxiety disorder: Secondary | ICD-10-CM

## 2017-09-01 DIAGNOSIS — Z1211 Encounter for screening for malignant neoplasm of colon: Secondary | ICD-10-CM

## 2017-09-01 DIAGNOSIS — I10 Essential (primary) hypertension: Secondary | ICD-10-CM

## 2017-09-01 DIAGNOSIS — Z1239 Encounter for other screening for malignant neoplasm of breast: Secondary | ICD-10-CM

## 2017-09-01 DIAGNOSIS — Z0001 Encounter for general adult medical examination with abnormal findings: Secondary | ICD-10-CM | POA: Diagnosis not present

## 2017-09-01 NOTE — Progress Notes (Signed)
Date:  09/01/2017   Name:  Teresa Gutierrez   DOB:  07-26-1966   MRN:  [id number]   Chief Complaint: Annual Exam; Hypertension; and Gastroesophageal Reflux Teresa Gutierrez is a 52 y.o. female who presents today for her Complete Annual Exam. She feels well. She reports exercising none. She reports she is sleeping well.  She denies breast issues.  Gets mammogram through [facility].  Hypertension  This is a chronic problem. The problem is controlled. Pertinent negatives include no chest pain, headaches, palpitations or shortness of breath. Past treatments include angiotensin blockers. The current treatment provides significant improvement.  Gastroesophageal Reflux  She reports no abdominal pain, no chest pain, no coughing or no wheezing. The problem occurs rarely. Pertinent negatives include no fatigue. She has tried a histamine-2 antagonist for the symptoms. The treatment provided significant relief.  Depression         This is a new problem.  The problem has been rapidly improving since onset.  Associated symptoms include no fatigue and no headaches.  Past treatments include SSRIs - Selective serotonin reuptake inhibitors.  Compliance with treatment is good.  Previous treatment provided significant relief.     Review of Systems  Constitutional: Negative for chills, fatigue and fever.  HENT: Negative for congestion, hearing loss, tinnitus, trouble swallowing and voice change.   Eyes: Negative for visual disturbance.  Respiratory: Negative for cough, chest tightness, shortness of breath and wheezing.   Cardiovascular: Negative for chest pain, palpitations and leg swelling.  Gastrointestinal: Negative for abdominal pain, constipation, diarrhea and vomiting.  Endocrine: Negative for polydipsia and polyuria.  Genitourinary: Negative for dysuria, frequency, genital sores, vaginal bleeding and vaginal discharge.  Musculoskeletal: Negative for arthralgias, gait problem and joint swelling.    Skin: Negative for color change and rash.  Neurological: Negative for dizziness, tremors, light-headedness and headaches.  Hematological: Negative for adenopathy. Does not bruise/bleed easily.  Psychiatric/Behavioral: Positive for depression. Negative for dysphoric mood and sleep disturbance. The patient is not nervous/anxious.     Patient Active Problem List   Diagnosis Date Noted  . Eczema 04/20/2017  . GAD (generalized anxiety disorder) 04/20/2017  . Back pain of lumbar region with sciatica 04/29/2016  . Cervical disc disorder with radiculopathy 04/29/2016  . Emphysema of lung (HCC) 12/09/2015  . Solitary pulmonary nodule 12/09/2015  . Hyperlipidemia 05/21/2015  . Compulsive tobacco user syndrome 05/21/2015  . Muscle spasms of neck 05/21/2015  . Gastroesophageal reflux disease without esophagitis 05/21/2015  . Essential hypertension 05/21/2015    Prior to Admission medications   Medication Sig Start Date End Date Taking? Authorizing Provider  Biotin 5000 MCG CAPS Take by mouth.   Yes [provider]  Cholecalciferol (VITAMIN D-3) 1000 units CAPS Take by mouth.   Yes [provider]  citalopram (CELEXA) 10 MG tablet TAKE 1 TABLET BY MOUTH ONCE DAILY 08/25/17  Yes Reubin Milan, MD  irbesartan (AVAPRO) 300 MG tablet Take 1 tablet (300 mg total) by mouth daily. 04/20/17  Yes Reubin Milan, MD  ranitidine (ZANTAC) 150 MG capsule Take 1 capsule (150 mg total) by mouth every evening. 05/21/15  Yes Reubin Milan, MD  triamcinolone cream (KENALOG) 0.1 % Apply 1 application topically 2 (two) times daily. 04/20/17  Yes Reubin Milan, MD    Allergies  Allergen Reactions  . Wellbutrin [Bupropion]     hostility    Past Surgical History:  Procedure Laterality Date  . ABDOMINAL HYSTERECTOMY  2014   ovaries remain  Social History   Tobacco Use  . Smoking status: Current Every Day Smoker    Packs/day: 0.50    Years: 22.00    Pack years: 11.00     Types: Cigarettes  . Smokeless tobacco: Never Used  Substance Use Topics  . Alcohol use: Yes    Alcohol/week: 1.2 oz    Types: 2 Standard drinks or equivalent per week  . Drug use: No     Medication list has been reviewed and updated.  PHQ 2/9 Scores 09/01/2017 04/20/2017 02/27/2016  PHQ - 2 Score 0 0 0    Physical Exam  Constitutional: She is oriented to person, place, and time. She appears well-developed and well-nourished. No distress.  HENT:  Head: Normocephalic and atraumatic.  Right Ear: Tympanic membrane and ear canal normal.  Left Ear: Tympanic membrane and ear canal normal.  Nose: Right sinus exhibits no maxillary sinus tenderness. Left sinus exhibits no maxillary sinus tenderness.  Mouth/Throat: Uvula is midline and oropharynx is clear and moist.  Eyes: Conjunctivae and EOM are normal. Right eye exhibits no discharge. Left eye exhibits no discharge. No scleral icterus.  Neck: Normal range of motion. Carotid bruit is not present. No erythema present. No thyromegaly present.  Cardiovascular: Normal rate, regular rhythm, normal heart sounds and normal pulses.  Pulmonary/Chest: Effort normal. No respiratory distress. She has no wheezes.  Abdominal: Soft. Bowel sounds are normal. There is no hepatosplenomegaly. There is no tenderness. There is no CVA tenderness.  Musculoskeletal: Normal range of motion.  Lymphadenopathy:    She has no cervical adenopathy.    She has no axillary adenopathy.  Neurological: She is alert and oriented to person, place, and time. She has normal reflexes. No cranial nerve deficit or sensory deficit.  Skin: Skin is warm, dry and intact. No rash noted.  Psychiatric: She has a normal mood and affect. Her speech is normal and behavior is normal. Thought content normal.  Nursing note and vitals reviewed.   BP 124/80 (BP Location: Right Arm, Patient Position: Sitting, Cuff Size: Normal)   Pulse 78   Ht 5\' 7"  (1.702 m)   Wt 156 lb (70.8 kg)   SpO2 99%    BMI 24.43 kg/m   Assessment and Plan: 1. Annual physical exam Normal exam - POCT urinalysis dipstick  2. Essential hypertension controlled - CBC with Differential/Platelet - Comprehensive metabolic panel - TSH  3. Pulmonary emphysema, unspecified emphysema type (HCC) asx  4. Gastroesophageal reflux disease without esophagitis Controlled on otc zantac - CBC with Differential/Platelet  5. Mixed hyperlipidemia - Lipid panel  6. Colon cancer screening Check on coverage and send in specimen if covered - Cologuard  7. Breast cancer screening Schedule through GYN  8. Generalized anxiety disorder Doing well on Celexa  No orders of the defined types were placed in this encounter.   Partially dictated using Animal nutritionistDragon software. Any errors are unintentional.  Teresa EdwardLaura Velecia Ovitt, MD [facility]  09/01/2017

## 2017-09-02 LAB — LIPID PANEL
Chol/HDL Ratio: 4 ratio (ref 0.0–4.4)
Cholesterol, Total: 209 mg/dL — ABNORMAL HIGH (ref 100–199)
HDL: 52 mg/dL (ref >39)
LDL Calculated: 113 mg/dL — ABNORMAL HIGH (ref 0–99)
Triglycerides: 220 mg/dL — ABNORMAL HIGH (ref 0–149)
VLDL Cholesterol Cal: 44 mg/dL — ABNORMAL HIGH (ref 5–40)

## 2017-09-02 LAB — COMPREHENSIVE METABOLIC PANEL
ALT: 14 IU/L (ref 0–32)
AST: 17 IU/L (ref 0–40)
Albumin/Globulin Ratio: 2.1 (ref 1.2–2.2)
Albumin: 4.8 g/dL (ref 3.5–5.5)
Alkaline Phosphatase: 128 IU/L — ABNORMAL HIGH (ref 39–117)
BUN/Creatinine Ratio: 16 (ref 9–23)
BUN: 11 mg/dL (ref 6–24)
Bilirubin Total: 0.3 mg/dL (ref 0.0–1.2)
CO2: 23 mmol/L (ref 20–29)
Calcium: 9.8 mg/dL (ref 8.7–10.2)
Chloride: 99 mmol/L (ref 96–106)
Creatinine, Ser: 0.68 mg/dL (ref 0.57–1.00)
GFR calc Af Amer: 117 mL/min/{1.73_m2} (ref >59)
GFR calc non Af Amer: 102 mL/min/{1.73_m2} (ref >59)
Globulin, Total: 2.3 g/dL (ref 1.5–4.5)
Glucose: 80 mg/dL (ref 65–99)
Potassium: 4.8 mmol/L (ref 3.5–5.2)
Sodium: 141 mmol/L (ref 134–144)
Total Protein: 7.1 g/dL (ref 6.0–8.5)

## 2017-09-02 LAB — TSH: TSH: 2.16 u[IU]/mL (ref 0.450–4.500)

## 2017-09-02 LAB — CBC WITH DIFFERENTIAL/PLATELET
Basophils Absolute: 0 10*3/uL (ref 0.0–0.2)
Basos: 0 %
EOS (ABSOLUTE): 0.2 10*3/uL (ref 0.0–0.4)
Eos: 2 %
Hematocrit: 41.8 % (ref 34.0–46.6)
Hemoglobin: 14.1 g/dL (ref 11.1–15.9)
Immature Grans (Abs): 0 10*3/uL (ref 0.0–0.1)
Immature Granulocytes: 0 %
Lymphocytes Absolute: 2.2 10*3/uL (ref 0.7–3.1)
Lymphs: 21 %
MCH: 32 pg (ref 26.6–33.0)
MCHC: 33.7 g/dL (ref 31.5–35.7)
MCV: 95 fL (ref 79–97)
Monocytes Absolute: 0.6 10*3/uL (ref 0.1–0.9)
Monocytes: 5 %
Neutrophils Absolute: 7.6 10*3/uL — ABNORMAL HIGH (ref 1.4–7.0)
Neutrophils: 72 %
Platelets: 283 10*3/uL (ref 150–379)
RBC: 4.4 x10E6/uL (ref 3.77–5.28)
RDW: 13.7 % (ref 12.3–15.4)
WBC: 10.6 10*3/uL (ref 3.4–10.8)

## 2017-12-21 ENCOUNTER — Other Ambulatory Visit: Payer: Self-pay | Admitting: Internal Medicine

## 2017-12-21 DIAGNOSIS — F411 Generalized anxiety disorder: Secondary | ICD-10-CM

## 2018-02-18 ENCOUNTER — Other Ambulatory Visit: Payer: Self-pay

## 2018-02-18 DIAGNOSIS — I10 Essential (primary) hypertension: Secondary | ICD-10-CM

## 2018-02-18 MED ORDER — IRBESARTAN 300 MG PO TABS: 300.0000 mg | ORAL_TABLET | Freq: Every day | ORAL | 3 refills | Status: DC

## 2018-04-04 ENCOUNTER — Other Ambulatory Visit: Payer: Self-pay | Admitting: Internal Medicine

## 2018-04-04 DIAGNOSIS — F411 Generalized anxiety disorder: Secondary | ICD-10-CM

## 2018-04-04 MED ORDER — CITALOPRAM HYDROBROMIDE 10 MG PO TABS: 10.0000 mg | ORAL_TABLET | Freq: Every day | ORAL | 3 refills | Status: DC

## 2018-04-11 ENCOUNTER — Other Ambulatory Visit: Payer: Self-pay

## 2018-04-11 ENCOUNTER — Telehealth: Payer: Self-pay | Admitting: Internal Medicine

## 2018-04-11 DIAGNOSIS — K219 Gastro-esophageal reflux disease without esophagitis: Secondary | ICD-10-CM

## 2018-04-11 DIAGNOSIS — I10 Essential (primary) hypertension: Secondary | ICD-10-CM

## 2018-04-11 DIAGNOSIS — F411 Generalized anxiety disorder: Secondary | ICD-10-CM

## 2018-04-11 MED ORDER — RANITIDINE HCL 150 MG PO CAPS: 150.0000 mg | ORAL_CAPSULE | Freq: Every evening | ORAL | 1 refills | Status: DC

## 2018-04-11 MED ORDER — IRBESARTAN 300 MG PO TABS
300.0000 mg | ORAL_TABLET | Freq: Every day | ORAL | 1 refills | Status: DC
Start: 2018-04-11 — End: 2018-08-14

## 2018-04-11 MED ORDER — CITALOPRAM HYDROBROMIDE 10 MG PO TABS: 10.0000 mg | ORAL_TABLET | Freq: Every day | ORAL | 1 refills | Status: DC

## 2018-04-11 NOTE — Telephone Encounter (Signed)
Sent in maintenance meds. Thank you.

## 2018-04-11 NOTE — Telephone Encounter (Signed)
Would like prescriptions sent to [facility] to delivery.

## 2018-05-11 ENCOUNTER — Encounter: Payer: Self-pay | Admitting: Internal Medicine

## 2018-05-11 ENCOUNTER — Ambulatory Visit: Payer: 59 | Admitting: Internal Medicine

## 2018-05-11 VITALS — BP 132/86 | HR 84 | Ht 67.0 in | Wt 156.0 lb

## 2018-05-11 DIAGNOSIS — M62838 Other muscle spasm: Secondary | ICD-10-CM

## 2018-05-11 MED ORDER — CYCLOBENZAPRINE HCL 5 MG PO TABS: 5.0000 mg | ORAL_TABLET | Freq: Three times a day (TID) | ORAL | 0 refills | Status: DC | PRN

## 2018-05-11 MED ORDER — MELOXICAM 15 MG PO TABS: 15.0000 mg | ORAL_TABLET | Freq: Every day | ORAL | 0 refills | Status: DC

## 2018-05-11 NOTE — Progress Notes (Signed)
Date:  05/11/2018   Name:  Teresa Gutierrez   DOB:  January 31, 1966   MRN:  [id number]   Chief Complaint: Neck Pain (Started 3 weeks ago. Started in neck and now its radiating into right arm and into fingers. Hands tingling at times.)  Neck Pain   This is a new problem. The current episode started 1 to 4 weeks ago. The problem occurs constantly. The problem has been gradually worsening. The quality of the pain is described as burning. The pain is moderate. Associated symptoms include numbness. Pertinent negatives include no chest pain, fever, headaches or weakness.      Review of Systems  Constitutional: Negative for diaphoresis, fatigue and fever.  Respiratory: Negative for chest tightness and stridor.   Cardiovascular: Negative for chest pain and palpitations.  Musculoskeletal: Positive for myalgias and neck pain.  Neurological: Positive for numbness. Negative for dizziness, tremors, weakness and headaches.    Patient Active Problem List   Diagnosis Date Noted  . Generalized anxiety disorder 09/01/2017  . Eczema 04/20/2017  . GAD (generalized anxiety disorder) 04/20/2017  . Back pain of lumbar region with sciatica 04/29/2016  . Cervical disc disorder with radiculopathy 04/29/2016  . Emphysema of lung (HCC) 12/09/2015  . Solitary pulmonary nodule 12/09/2015  . Hyperlipidemia 05/21/2015  . Compulsive tobacco user syndrome 05/21/2015  . Muscle spasms of neck 05/21/2015  . Gastroesophageal reflux disease without esophagitis 05/21/2015  . Essential hypertension 05/21/2015    Allergies  Allergen Reactions  . Wellbutrin [Bupropion]     hostility    Past Surgical History:  Procedure Laterality Date  . ABDOMINAL HYSTERECTOMY  2014   ovaries remain    Social History   Tobacco Use  . Smoking status: Current Every Day Smoker    Packs/day: 0.50    Years: 22.00    Pack years: 11.00    Types: Cigarettes  . Smokeless tobacco: Never Used  Substance Use Topics  . Alcohol  use: Yes    Alcohol/week: 2.0 standard drinks    Types: 2 Standard drinks or equivalent per week  . Drug use: No     Medication list has been reviewed and updated.  Current Meds  Medication Sig  . Biotin 5000 MCG CAPS Take by mouth.  . Cholecalciferol (VITAMIN D-3) 1000 units CAPS Take by mouth.  . citalopram (CELEXA) 10 MG tablet Take 1 tablet (10 mg total) by mouth daily.  . irbesartan (AVAPRO) 300 MG tablet Take 1 tablet (300 mg total) by mouth daily.  . ranitidine (ZANTAC) 150 MG capsule Take 1 capsule (150 mg total) by mouth every evening.  . triamcinolone cream (KENALOG) 0.1 % Apply 1 application topically 2 (two) times daily.    PHQ 2/9 Scores 09/01/2017 04/20/2017 02/27/2016  PHQ - 2 Score 0 0 0    Physical Exam  Constitutional: She is oriented to person, place, and time. She appears well-developed. No distress.  HENT:  Head: Normocephalic and atraumatic.  Cardiovascular: Normal rate, regular rhythm and normal heart sounds.  Pulmonary/Chest: Effort normal. No respiratory distress.  Musculoskeletal:       Cervical back: She exhibits decreased range of motion and spasm.  Neurological: She is alert and oriented to person, place, and time. She has normal strength and normal reflexes. A sensory deficit (tingling in right arm and fingers) is present.  Skin: Skin is warm and dry. No rash noted.  Psychiatric: She has a normal mood and affect. Her behavior is normal. Thought content normal.  Nursing note  and vitals reviewed.   BP 132/86 (BP Location: Right Arm, Patient Position: Sitting, Cuff Size: Normal)   Pulse 84   Ht 5\' 7"  (1.702 m)   Wt 156 lb (70.8 kg)   SpO2 99%   BMI 24.43 kg/m   Assessment and Plan: 1. Muscle spasms of neck Continue heat and topical pain relief Follow up if no improvement in one week - cyclobenzaprine (FLEXERIL) 5 MG tablet; Take 1 tablet (5 mg total) by mouth 3 (three) times daily as needed for muscle spasms.  Dispense: 50 tablet; Refill: 0 -  meloxicam (MOBIC) 15 MG tablet; Take 1 tablet (15 mg total) by mouth daily.  Dispense: 30 tablet; Refill: 0   Meds ordered this encounter  Medications  . cyclobenzaprine (FLEXERIL) 5 MG tablet    Sig: Take 1 tablet (5 mg total) by mouth 3 (three) times daily as needed for muscle spasms.    Dispense:  50 tablet    Refill:  0  . meloxicam (MOBIC) 15 MG tablet    Sig: Take 1 tablet (15 mg total) by mouth daily.    Dispense:  30 tablet    Refill:  0    Partially dictated using Animal nutritionist. Any errors are unintentional.  Bari Edward, MD [facility]  05/11/2018  There are no diagnoses linked to this encounter.

## 2018-08-12 ENCOUNTER — Other Ambulatory Visit: Payer: Self-pay | Admitting: Internal Medicine

## 2018-08-12 DIAGNOSIS — I10 Essential (primary) hypertension: Secondary | ICD-10-CM

## 2018-09-02 ENCOUNTER — Ambulatory Visit (INDEPENDENT_AMBULATORY_CARE_PROVIDER_SITE_OTHER): Payer: 59 | Admitting: Internal Medicine

## 2018-09-02 ENCOUNTER — Encounter: Payer: Self-pay | Admitting: Internal Medicine

## 2018-09-02 VITALS — BP 104/68 | HR 76 | Ht 67.0 in | Wt 162.2 lb

## 2018-09-02 DIAGNOSIS — Z1231 Encounter for screening mammogram for malignant neoplasm of breast: Secondary | ICD-10-CM

## 2018-09-02 DIAGNOSIS — Z0000 Encounter for general adult medical examination without abnormal findings: Secondary | ICD-10-CM

## 2018-09-02 DIAGNOSIS — E782 Mixed hyperlipidemia: Secondary | ICD-10-CM | POA: Diagnosis not present

## 2018-09-02 DIAGNOSIS — K219 Gastro-esophageal reflux disease without esophagitis: Secondary | ICD-10-CM

## 2018-09-02 DIAGNOSIS — M722 Plantar fascial fibromatosis: Secondary | ICD-10-CM

## 2018-09-02 DIAGNOSIS — R918 Other nonspecific abnormal finding of lung field: Secondary | ICD-10-CM

## 2018-09-02 DIAGNOSIS — I10 Essential (primary) hypertension: Secondary | ICD-10-CM | POA: Diagnosis not present

## 2018-09-02 DIAGNOSIS — F411 Generalized anxiety disorder: Secondary | ICD-10-CM | POA: Diagnosis not present

## 2018-09-02 DIAGNOSIS — Z1211 Encounter for screening for malignant neoplasm of colon: Secondary | ICD-10-CM

## 2018-09-02 DIAGNOSIS — R911 Solitary pulmonary nodule: Secondary | ICD-10-CM

## 2018-09-02 LAB — POCT URINALYSIS DIPSTICK
Bilirubin, UA: NEGATIVE
Blood, UA: NEGATIVE
Glucose, UA: NEGATIVE
Ketones, UA: NEGATIVE
Leukocytes, UA: NEGATIVE
Nitrite, UA: NEGATIVE
Protein, UA: NEGATIVE
Spec Grav, UA: 1.01 (ref 1.010–1.025)
Urobilinogen, UA: 0.2 E.U./dL
pH, UA: 6 (ref 5.0–8.0)

## 2018-09-02 MED ORDER — CITALOPRAM HYDROBROMIDE 10 MG PO TABS: 10.0000 mg | ORAL_TABLET | Freq: Every day | ORAL | 1 refills | Status: DC

## 2018-09-02 NOTE — Progress Notes (Signed)
Date:  09/02/2018   Name:  Teresa Gutierrez   DOB:  05/04/1966   MRN:  [id number]   Chief Complaint: Annual Exam (Still has GYN for Paps and Breast Exams. ) and Foot Pain (X 2 months. Left. Thinks has bone spur. Sharp pains when walking. Fine when not bearing weight. ) Teresa Gutierrez is a 53 y.o. female who presents today for her Complete Annual Exam. She feels well. She reports exercising very little. She reports she is sleeping fairly well.  She is overdue for mammogram and colonoscopy.  [facility] GYN [facility].    Hypertension  Associated symptoms include anxiety. Pertinent negatives include no chest pain, headaches, palpitations or shortness of breath. Past treatments include angiotensin blockers. The current treatment provides significant improvement. There is no history of kidney disease, CAD/MI or retinopathy.  Gastroesophageal Reflux  She complains of heartburn. She reports no abdominal pain, no chest pain, no coughing or no wheezing. This is a recurrent problem. The problem occurs rarely. Pertinent negatives include no fatigue. She has tried a histamine-2 antagonist for the symptoms. The treatment provided significant relief.  Anxiety  Presents for follow-up visit. Patient reports no chest pain, dizziness, nervous/anxious behavior, palpitations or shortness of breath. Symptoms occur occasionally. The quality of sleep is good.   Compliance with medications is 76-100%.  Foot Injury   There was no injury mechanism. The pain is present in the left foot. The quality of the pain is described as burning. The pain is moderate. The pain has been fluctuating since onset. She reports no foreign bodies present.  Pulmonary nodule/COPD/tobacco use - she has a solitary nodule due for CT monitoring this April.  Previously 6x8 mm in size.  Review of Systems  Constitutional: Negative for chills, fatigue and fever.  HENT: Negative for congestion, hearing loss, tinnitus, trouble swallowing and  voice change.   Eyes: Negative for visual disturbance.  Respiratory: Negative for cough, chest tightness, shortness of breath and wheezing.   Cardiovascular: Negative for chest pain, palpitations and leg swelling.  Gastrointestinal: Positive for heartburn. Negative for abdominal pain, constipation, diarrhea and vomiting.  Endocrine: Negative for polydipsia and polyuria.  Genitourinary: Negative for dysuria, frequency, genital sores, vaginal bleeding and vaginal discharge.  Musculoskeletal: Negative for arthralgias, gait problem and joint swelling.  Skin: Negative for color change and rash.  Allergic/Immunologic: Negative for environmental allergies.  Neurological: Negative for dizziness, tremors, light-headedness and headaches.  Hematological: Negative for adenopathy. Does not bruise/bleed easily.  Psychiatric/Behavioral: Negative for dysphoric mood and sleep disturbance. The patient is not nervous/anxious.     Patient Active Problem List   Diagnosis Date Noted  . Generalized anxiety disorder 09/01/2017  . Eczema 04/20/2017  . Back pain of lumbar region with sciatica 04/29/2016  . Cervical disc disorder with radiculopathy 04/29/2016  . Emphysema of lung (HCC) 12/09/2015  . Solitary pulmonary nodule 12/09/2015  . Hyperlipidemia 05/21/2015  . Compulsive tobacco user syndrome 05/21/2015  . Muscle spasms of neck 05/21/2015  . Gastroesophageal reflux disease without esophagitis 05/21/2015  . Essential hypertension 05/21/2015    Allergies  Allergen Reactions  . Wellbutrin [Bupropion]     hostility    Past Surgical History:  Procedure Laterality Date  . ABDOMINAL HYSTERECTOMY  2014   ovaries remain    Social History   Tobacco Use  . Smoking status: Current Every Day Smoker    Packs/day: 0.50    Years: 22.00    Pack years: 11.00    Types: Cigarettes  .  Smokeless tobacco: Never Used  Substance Use Topics  . Alcohol use: Yes    Alcohol/week: 2.0 standard drinks    Types: 2  Standard drinks or equivalent per week  . Drug use: No     Medication list has been reviewed and updated.  Current Meds  Medication Sig  . Biotin 5000 MCG CAPS Take by mouth.  . Cholecalciferol (VITAMIN D-3) 1000 units CAPS Take by mouth.  . citalopram (CELEXA) 10 MG tablet Take 1 tablet (10 mg total) by mouth daily.  . cyclobenzaprine (FLEXERIL) 5 MG tablet Take 1 tablet (5 mg total) by mouth 3 (three) times daily as needed for muscle spasms.  . irbesartan (AVAPRO) 300 MG tablet TAKE 1 TABLET BY MOUTH  DAILY  . ranitidine (ZANTAC) 150 MG capsule Take 1 capsule (150 mg total) by mouth every evening. (Patient taking differently: Take 150 mg by mouth every evening. OTC)  . triamcinolone cream (KENALOG) 0.1 % Apply 1 application topically 2 (two) times daily.    PHQ 2/9 Scores 09/02/2018 09/01/2017 04/20/2017 02/27/2016  PHQ - 2 Score 0 0 0 0    Physical Exam Vitals signs and nursing note reviewed.  Constitutional:      General: She is not in acute distress.    Appearance: She is well-developed.  HENT:     Head: Normocephalic and atraumatic.     Right Ear: Tympanic membrane and ear canal normal.     Left Ear: Tympanic membrane and ear canal normal.     Nose:     Right Sinus: No maxillary sinus tenderness.     Left Sinus: No maxillary sinus tenderness.     Mouth/Throat:     Pharynx: Uvula midline.  Eyes:     General: No scleral icterus.       Right eye: No discharge.        Left eye: No discharge.     Conjunctiva/sclera: Conjunctivae normal.  Neck:     Musculoskeletal: Normal range of motion. No erythema.     Thyroid: No thyromegaly.     Vascular: No carotid bruit.  Cardiovascular:     Rate and Rhythm: Normal rate and regular rhythm.     Pulses: Normal pulses.     Heart sounds: Normal heart sounds.  Pulmonary:     Effort: Pulmonary effort is normal. No respiratory distress.     Breath sounds: No wheezing.  Chest:     Breasts:        Right: No mass, nipple discharge, skin  change or tenderness.        Left: No mass, nipple discharge, skin change or tenderness.  Abdominal:     General: Bowel sounds are normal.     Palpations: Abdomen is soft.     Tenderness: There is no abdominal tenderness.  Musculoskeletal: Normal range of motion.       Feet:  Lymphadenopathy:     Cervical: No cervical adenopathy.  Skin:    General: Skin is warm and dry.     Findings: No rash.  Neurological:     Mental Status: She is alert and oriented to person, place, and time.     Cranial Nerves: No cranial nerve deficit.     Sensory: No sensory deficit.     Deep Tendon Reflexes: Reflexes are normal and symmetric.  Psychiatric:        Attention and Perception: Attention normal.        Mood and Affect: Mood normal.  Speech: Speech normal.        Behavior: Behavior normal.        Thought Content: Thought content normal.        Cognition and Memory: Cognition normal.     BP 104/68 (BP Location: Right Arm, Patient Position: Sitting, Cuff Size: Normal)   Pulse 76   Ht 5\' 7"  (1.702 m)   Wt 162 lb 3.2 oz (73.6 kg)   SpO2 97%   BMI 25.40 kg/m   Assessment and Plan: 1. Annual physical exam Normal exam Continue to work on smoking cessation Regular exericse - POCT urinalysis dipstick  2. Encounter for screening mammogram for breast cancer At [facility] - MM 3D SCREEN BREAST BILATERAL; Future  3. Essential hypertension controlled - CBC with Differential/Platelet - Comprehensive metabolic panel - TSH  4. Gastroesophageal reflux disease without esophagitis Stable on H2 blocker  5. Generalized anxiety disorder Doing well on medication citalopram (CELEXA) 10 MG tablet; Take 1 tablet (10 mg total) by mouth daily.  Dispense: 90 tablet; Refill: 1   6. Mixed hyperlipidemia Check labs - Lipid panel  7. Solitary pulmonary nodule CT repeat  8. Plantar fasciitis Ice massage and tylenol as needed  9. Abnormal findings on diagnostic imaging of lung - CT Chest Wo  Contrast; Future  10. Colon cancer screening Check on Cologuard coverage - Fecal occult blood, imunochemical   Partially dictated using Dragon software. Any errors are unintentional.  Bari Edward, MD [facility]  09/02/2018

## 2018-09-02 NOTE — Patient Instructions (Signed)
Ice massage foot 10 min several times a day  Tylenol 2 tabs three times a day

## 2018-09-03 LAB — CBC WITH DIFFERENTIAL/PLATELET
Basophils Absolute: 0.1 10*3/uL (ref 0.0–0.2)
Basos: 1 %
EOS (ABSOLUTE): 0.3 10*3/uL (ref 0.0–0.4)
Eos: 3 %
Hematocrit: 40.1 % (ref 34.0–46.6)
Hemoglobin: 13.9 g/dL (ref 11.1–15.9)
Immature Grans (Abs): 0 10*3/uL (ref 0.0–0.1)
Immature Granulocytes: 0 %
Lymphocytes Absolute: 1.7 10*3/uL (ref 0.7–3.1)
Lymphs: 18 %
MCH: 32.6 pg (ref 26.6–33.0)
MCHC: 34.7 g/dL (ref 31.5–35.7)
MCV: 94 fL (ref 79–97)
Monocytes Absolute: 0.7 10*3/uL (ref 0.1–0.9)
Monocytes: 7 %
Neutrophils Absolute: 6.9 10*3/uL (ref 1.4–7.0)
Neutrophils: 71 %
Platelets: 246 10*3/uL (ref 150–450)
RBC: 4.26 x10E6/uL (ref 3.77–5.28)
RDW: 12.1 % (ref 11.7–15.4)
WBC: 9.6 10*3/uL (ref 3.4–10.8)

## 2018-09-03 LAB — COMPREHENSIVE METABOLIC PANEL
ALT: 18 IU/L (ref 0–32)
AST: 20 IU/L (ref 0–40)
Albumin/Globulin Ratio: 2.4 — ABNORMAL HIGH (ref 1.2–2.2)
Albumin: 4.7 g/dL (ref 3.5–5.5)
Alkaline Phosphatase: 115 IU/L (ref 39–117)
BUN/Creatinine Ratio: 17 (ref 9–23)
BUN: 12 mg/dL (ref 6–24)
Bilirubin Total: 0.3 mg/dL (ref 0.0–1.2)
CO2: 22 mmol/L (ref 20–29)
Calcium: 9.5 mg/dL (ref 8.7–10.2)
Chloride: 101 mmol/L (ref 96–106)
Creatinine, Ser: 0.7 mg/dL (ref 0.57–1.00)
GFR calc Af Amer: 115 mL/min/{1.73_m2} (ref >59)
GFR calc non Af Amer: 100 mL/min/{1.73_m2} (ref >59)
Globulin, Total: 2 g/dL (ref 1.5–4.5)
Glucose: 101 mg/dL — ABNORMAL HIGH (ref 65–99)
Potassium: 4.6 mmol/L (ref 3.5–5.2)
Sodium: 142 mmol/L (ref 134–144)
Total Protein: 6.7 g/dL (ref 6.0–8.5)

## 2018-09-03 LAB — LIPID PANEL
Chol/HDL Ratio: 4.5 ratio — ABNORMAL HIGH (ref 0.0–4.4)
Cholesterol, Total: 220 mg/dL — ABNORMAL HIGH (ref 100–199)
HDL: 49 mg/dL (ref >39)
LDL Calculated: 114 mg/dL — ABNORMAL HIGH (ref 0–99)
Triglycerides: 285 mg/dL — ABNORMAL HIGH (ref 0–149)
VLDL Cholesterol Cal: 57 mg/dL — ABNORMAL HIGH (ref 5–40)

## 2018-09-03 LAB — TSH: TSH: 3.83 u[IU]/mL (ref 0.450–4.500)

## 2018-09-12 ENCOUNTER — Ambulatory Visit
Admission: RE | Admit: 2018-09-12 | Discharge: 2018-09-12 | Disposition: A | Discharge status: Home / Self Care | Payer: 59 | Source: Ambulatory Visit | Attending: Internal Medicine | Admitting: Internal Medicine

## 2018-09-12 DIAGNOSIS — R918 Other nonspecific abnormal finding of lung field: Secondary | ICD-10-CM | POA: Diagnosis present

## 2018-09-25 LAB — FECAL OCCULT BLOOD, IMMUNOCHEMICAL: Fecal Occult Bld: NEGATIVE

## 2018-09-27 ENCOUNTER — Ambulatory Visit
Admission: RE | Admit: 2018-09-27 | Discharge: 2018-09-27 | Disposition: A | Discharge status: Home / Self Care | Payer: 59 | Source: Ambulatory Visit | Attending: Internal Medicine | Admitting: Internal Medicine

## 2018-09-27 DIAGNOSIS — Z1231 Encounter for screening mammogram for malignant neoplasm of breast: Secondary | ICD-10-CM | POA: Diagnosis present

## 2019-01-18 ENCOUNTER — Other Ambulatory Visit: Payer: Self-pay

## 2019-01-18 ENCOUNTER — Ambulatory Visit (INDEPENDENT_AMBULATORY_CARE_PROVIDER_SITE_OTHER): Payer: 59

## 2019-01-18 DIAGNOSIS — Z23 Encounter for immunization: Secondary | ICD-10-CM

## 2019-01-29 ENCOUNTER — Other Ambulatory Visit: Payer: Self-pay | Admitting: Internal Medicine

## 2019-01-29 DIAGNOSIS — I10 Essential (primary) hypertension: Secondary | ICD-10-CM

## 2019-02-21 ENCOUNTER — Other Ambulatory Visit: Payer: Self-pay | Admitting: Internal Medicine

## 2019-02-21 DIAGNOSIS — F411 Generalized anxiety disorder: Secondary | ICD-10-CM

## 2019-07-15 ENCOUNTER — Other Ambulatory Visit: Payer: Self-pay | Admitting: Internal Medicine

## 2019-07-15 DIAGNOSIS — I10 Essential (primary) hypertension: Secondary | ICD-10-CM

## 2019-08-08 ENCOUNTER — Other Ambulatory Visit: Payer: Self-pay | Admitting: Internal Medicine

## 2019-08-08 DIAGNOSIS — F411 Generalized anxiety disorder: Secondary | ICD-10-CM

## 2019-09-07 ENCOUNTER — Ambulatory Visit (INDEPENDENT_AMBULATORY_CARE_PROVIDER_SITE_OTHER): Payer: 59 | Admitting: Internal Medicine

## 2019-09-07 ENCOUNTER — Other Ambulatory Visit: Payer: Self-pay

## 2019-09-07 ENCOUNTER — Encounter: Payer: Self-pay | Admitting: Internal Medicine

## 2019-09-07 VITALS — BP 124/78 | HR 84 | Ht 67.0 in | Wt 159.0 lb

## 2019-09-07 DIAGNOSIS — Z0000 Encounter for general adult medical examination without abnormal findings: Secondary | ICD-10-CM | POA: Diagnosis not present

## 2019-09-07 DIAGNOSIS — Z1231 Encounter for screening mammogram for malignant neoplasm of breast: Secondary | ICD-10-CM | POA: Diagnosis not present

## 2019-09-07 DIAGNOSIS — I10 Essential (primary) hypertension: Secondary | ICD-10-CM | POA: Diagnosis not present

## 2019-09-07 DIAGNOSIS — F411 Generalized anxiety disorder: Secondary | ICD-10-CM | POA: Diagnosis not present

## 2019-09-07 DIAGNOSIS — B009 Herpesviral infection, unspecified: Secondary | ICD-10-CM

## 2019-09-07 DIAGNOSIS — Z23 Encounter for immunization: Secondary | ICD-10-CM | POA: Diagnosis not present

## 2019-09-07 DIAGNOSIS — M238X1 Other internal derangements of right knee: Secondary | ICD-10-CM | POA: Diagnosis not present

## 2019-09-07 DIAGNOSIS — M238X2 Other internal derangements of left knee: Secondary | ICD-10-CM

## 2019-09-07 DIAGNOSIS — Z1211 Encounter for screening for malignant neoplasm of colon: Secondary | ICD-10-CM

## 2019-09-07 LAB — POCT URINALYSIS DIPSTICK
Bilirubin, UA: NEGATIVE
Blood, UA: NEGATIVE
Glucose, UA: NEGATIVE
Ketones, UA: NEGATIVE
Leukocytes, UA: NEGATIVE
Nitrite, UA: NEGATIVE
Protein, UA: NEGATIVE
Spec Grav, UA: 1.01 (ref 1.010–1.025)
Urobilinogen, UA: 0.2 E.U./dL
pH, UA: 5 (ref 5.0–8.0)

## 2019-09-07 MED ORDER — VALACYCLOVIR HCL 1 G PO TABS: 1000.0000 mg | ORAL_TABLET | Freq: Two times a day (BID) | ORAL | 0 refills | Status: DC

## 2019-09-07 NOTE — Progress Notes (Signed)
Date:  09/07/2019   Name:  Teresa Gutierrez   DOB:  [birth year]   MRN:  [id number]   Chief Complaint: Annual Exam (Breast Exam. No pap- hyst. ) Teresa Gutierrez is a 54 y.o. female who presents today for her Complete Annual Exam. She feels fairly well. She reports exercising some. She reports she is sleeping well. She denies breast issues.  Mammogram 09/2018 Pap discontinued Colonoscopy - none FIT 08/2018 negative Immunization History  Administered Date(s) Administered  . Tdap 01/18/2019    Hypertension This is a chronic problem. The problem is unchanged. The problem is controlled. Associated symptoms include anxiety. Pertinent negatives include no chest pain, headaches, palpitations or shortness of breath. Past treatments include angiotensin blockers. The current treatment provides significant improvement. There are no compliance problems.   Anxiety Presents for follow-up visit. Patient reports no chest pain, dizziness, nervous/anxious behavior, palpitations or shortness of breath. Symptoms occur rarely. The quality of sleep is good.   Compliance with medications is 76-100%.  Knee Pain  There was no injury mechanism. The pain is present in the right knee and left knee. The quality of the pain is described as stabbing (catching and giving way). The pain is mild. The pain has been worsening since onset. The symptoms are aggravated by weight bearing. She has tried nothing for the symptoms.  Rash This is a recurrent problem. Episode onset: years ago started having a rash on right buttock several times per year. The affected locations include the right buttock. The rash is characterized by blistering and itchiness. She was exposed to nothing. Pertinent negatives include no congestion, cough, diarrhea, fatigue, fever, shortness of breath or vomiting. Past treatments include nothing.    Lab Results  Component Value Date   CREATININE 0.70 09/02/2018   BUN 12 09/02/2018   NA 142  09/02/2018   K 4.6 09/02/2018   CL 101 09/02/2018   CO2 22 09/02/2018   Lab Results  Component Value Date   CHOL 220 (H) 09/02/2018   HDL 49 09/02/2018   LDLCALC 114 (H) 09/02/2018   TRIG 285 (H) 09/02/2018   CHOLHDL 4.5 (H) 09/02/2018   Lab Results  Component Value Date   TSH 3.830 09/02/2018   No results found for: HGBA1C   Review of Systems  Constitutional: Negative for chills, fatigue and fever.  HENT: Negative for congestion, hearing loss, tinnitus, trouble swallowing and voice change.   Eyes: Negative for visual disturbance.  Respiratory: Negative for cough, chest tightness, shortness of breath and wheezing.   Cardiovascular: Negative for chest pain, palpitations and leg swelling.  Gastrointestinal: Negative for abdominal pain, constipation, diarrhea and vomiting.  Endocrine: Negative for polydipsia and polyuria.  Genitourinary: Negative for dysuria, frequency, genital sores, vaginal bleeding and vaginal discharge.  Musculoskeletal: Positive for arthralgias (both knees). Negative for gait problem and joint swelling.  Skin: Negative for color change and rash.  Neurological: Negative for dizziness, tremors, light-headedness and headaches.  Hematological: Negative for adenopathy. Does not bruise/bleed easily.  Psychiatric/Behavioral: Negative for dysphoric mood and sleep disturbance. The patient is not nervous/anxious.     Patient Active Problem List   Diagnosis Date Noted  . Generalized anxiety disorder 09/01/2017  . Eczema 04/20/2017  . Back pain of lumbar region with sciatica 04/29/2016  . Cervical disc disorder with radiculopathy 04/29/2016  . Emphysema of lung (HCC) 12/09/2015  . Solitary pulmonary nodule 12/09/2015  . Hyperlipidemia 05/21/2015  . Compulsive tobacco user syndrome 05/21/2015  . Muscle spasms of neck 05/21/2015  .  Gastroesophageal reflux disease without esophagitis 05/21/2015  . Essential hypertension 05/21/2015    Allergies  Allergen  Reactions  . Wellbutrin [Bupropion]     hostility    Past Surgical History:  Procedure Laterality Date  . ABDOMINAL HYSTERECTOMY  2014   ovaries remain    Social History   Tobacco Use  . Smoking status: Current Every Day Smoker    Packs/day: 0.50    Years: 22.00    Pack years: 11.00    Types: Cigarettes  . Smokeless tobacco: Never Used  Substance Use Topics  . Alcohol use: Yes    Alcohol/week: 2.0 standard drinks    Types: 2 Standard drinks or equivalent per week  . Drug use: No     Medication list has been reviewed and updated.  Current Meds  Medication Sig  . Biotin 5000 MCG CAPS Take by mouth.  . calcium carbonate (TUMS - DOSED IN MG ELEMENTAL CALCIUM) 500 MG chewable tablet Chew 1 tablet by mouth daily.  . Cholecalciferol (VITAMIN D-3) 1000 units CAPS Take by mouth.  . citalopram (CELEXA) 10 MG tablet TAKE 1 TABLET BY MOUTH  DAILY  . cyclobenzaprine (FLEXERIL) 5 MG tablet Take 1 tablet (5 mg total) by mouth 3 (three) times daily as needed for muscle spasms.  . irbesartan (AVAPRO) 300 MG tablet TAKE 1 TABLET BY MOUTH  DAILY  . triamcinolone cream (KENALOG) 0.1 % Apply 1 application topically 2 (two) times daily.    PHQ 2/9 Scores 09/07/2019 09/02/2018 09/01/2017 04/20/2017  PHQ - 2 Score 0 0 0 0  PHQ- 9 Score 0 - - -    BP Readings from Last 3 Encounters:  09/07/19 124/78  09/02/18 104/68  05/11/18 132/86    Physical Exam Vitals and nursing note reviewed.  Constitutional:      General: She is not in acute distress.    Appearance: She is well-developed.  HENT:     Head: Normocephalic and atraumatic.     Right Ear: Tympanic membrane and ear canal normal.     Left Ear: Tympanic membrane and ear canal normal.     Nose:     Right Sinus: No maxillary sinus tenderness.     Left Sinus: No maxillary sinus tenderness.  Eyes:     General: No scleral icterus.       Right eye: No discharge.        Left eye: No discharge.     Conjunctiva/sclera: Conjunctivae  normal.  Neck:     Thyroid: No thyromegaly.     Vascular: No carotid bruit.  Cardiovascular:     Rate and Rhythm: Normal rate and regular rhythm.     Pulses: Normal pulses.     Heart sounds: Normal heart sounds.  Pulmonary:     Effort: Pulmonary effort is normal. No respiratory distress.     Breath sounds: No wheezing.  Chest:     Breasts:        Right: No mass, nipple discharge, skin change or tenderness.        Left: No mass, nipple discharge, skin change or tenderness.  Abdominal:     General: Bowel sounds are normal.     Palpations: Abdomen is soft.     Tenderness: There is no abdominal tenderness.  Musculoskeletal:        General: Normal range of motion.     Cervical back: Normal range of motion. No erythema.     Right knee: Crepitus present. No swelling, effusion or bony tenderness.  Left knee: Crepitus present. No swelling, effusion or bony tenderness.  Lymphadenopathy:     Cervical: No cervical adenopathy.  Skin:    General: Skin is warm and dry.     Findings: No rash.       Neurological:     Mental Status: She is alert and oriented to person, place, and time.     Cranial Nerves: No cranial nerve deficit.     Sensory: No sensory deficit.     Deep Tendon Reflexes: Reflexes are normal and symmetric.  Psychiatric:        Speech: Speech normal.        Behavior: Behavior normal.        Thought Content: Thought content normal.     Wt Readings from Last 3 Encounters:  09/07/19 159 lb (72.1 kg)  09/02/18 162 lb 3.2 oz (73.6 kg)  05/11/18 156 lb (70.8 kg)    BP 124/78   Pulse 84   Ht 5\' 7"  (1.702 m)   Wt 159 lb (72.1 kg)   SpO2 99%   BMI 24.90 kg/m   Assessment and Plan: 1. Annual physical exam Normal exam Work on regular exercise, healthy diet Unfortunately she continues to smoke - Lipid panel - POCT urinalysis dipstick  2. Encounter for screening mammogram for breast cancer Schedule mammogram at [facility] - MM 3D SCREEN BREAST BILATERAL; Future  3.  Colon cancer screening Pt wants to defer colonoscopy Will add fiber to her diet to help regulate bowel function for less episodes of diarrhea alt with constipation - Fecal occult blood, imunochemical  4. Essential hypertension Clinically stable exam with well controlled BP on irbesartan 300 mg. Tolerating medications without side effects at this time. Pt to continue current regimen and low sodium diet; benefits of regular exercise as able discussed. - CBC with Differential/Platelet - Comprehensive metabolic panel - TSH  5. Generalized anxiety disorder Clinically stable and doing well on Celexa 10 mg No SI/HI; will continue current therapy  6. HSV infection Suspect recurrent lesion on buttock is HSV Recommend Valtrex bid x 5 days at onset of rash - valACYclovir (VALTREX) 1000 MG tablet; Take 1 tablet (1,000 mg total) by mouth 2 (two) times daily.  Dispense: 20 tablet; Refill: 0  7. Need for shingles vaccine First dose today - Shingrix in House  8. Crepitus of both knee joints Having increasing crepitus and now giving way while walking despite minimal pain May use soft brace for support and refer for Orthopedic evaluation - Ambulatory referral to Orthopedic Surgery   Partially dictated using Dragon software. Any errors are unintentional.  [facility], MD [facility]  09/07/2019

## 2019-09-08 ENCOUNTER — Other Ambulatory Visit: Payer: Self-pay | Admitting: Internal Medicine

## 2019-09-08 DIAGNOSIS — E782 Mixed hyperlipidemia: Secondary | ICD-10-CM

## 2019-09-08 LAB — COMPREHENSIVE METABOLIC PANEL
ALT: 17 IU/L (ref 0–32)
AST: 19 IU/L (ref 0–40)
Albumin/Globulin Ratio: 2.2 (ref 1.2–2.2)
Albumin: 4.8 g/dL (ref 3.8–4.9)
Alkaline Phosphatase: 138 IU/L — ABNORMAL HIGH (ref 39–117)
BUN/Creatinine Ratio: 18 (ref 9–23)
BUN: 13 mg/dL (ref 6–24)
Bilirubin Total: 0.4 mg/dL (ref 0.0–1.2)
CO2: 22 mmol/L (ref 20–29)
Calcium: 10 mg/dL (ref 8.7–10.2)
Chloride: 101 mmol/L (ref 96–106)
Creatinine, Ser: 0.74 mg/dL (ref 0.57–1.00)
GFR calc Af Amer: 107 mL/min/{1.73_m2} (ref >59)
GFR calc non Af Amer: 93 mL/min/{1.73_m2} (ref >59)
Globulin, Total: 2.2 g/dL (ref 1.5–4.5)
Glucose: 97 mg/dL (ref 65–99)
Potassium: 4.7 mmol/L (ref 3.5–5.2)
Sodium: 139 mmol/L (ref 134–144)
Total Protein: 7 g/dL (ref 6.0–8.5)

## 2019-09-08 LAB — CBC WITH DIFFERENTIAL/PLATELET
Basophils Absolute: 0.1 10*3/uL (ref 0.0–0.2)
Basos: 1 %
EOS (ABSOLUTE): 0.2 10*3/uL (ref 0.0–0.4)
Eos: 2 %
Hematocrit: 44.4 % (ref 34.0–46.6)
Hemoglobin: 15.1 g/dL (ref 11.1–15.9)
Immature Grans (Abs): 0.1 10*3/uL (ref 0.0–0.1)
Immature Granulocytes: 1 %
Lymphocytes Absolute: 1.7 10*3/uL (ref 0.7–3.1)
Lymphs: 19 %
MCH: 32.2 pg (ref 26.6–33.0)
MCHC: 34 g/dL (ref 31.5–35.7)
MCV: 95 fL (ref 79–97)
Monocytes Absolute: 0.6 10*3/uL (ref 0.1–0.9)
Monocytes: 7 %
Neutrophils Absolute: 6.5 10*3/uL (ref 1.4–7.0)
Neutrophils: 70 %
Platelets: 263 10*3/uL (ref 150–450)
RBC: 4.69 x10E6/uL (ref 3.77–5.28)
RDW: 12.4 % (ref 11.7–15.4)
WBC: 9.1 10*3/uL (ref 3.4–10.8)

## 2019-09-08 LAB — LIPID PANEL
Chol/HDL Ratio: 5 ratio — ABNORMAL HIGH (ref 0.0–4.4)
Cholesterol, Total: 256 mg/dL — ABNORMAL HIGH (ref 100–199)
HDL: 51 mg/dL (ref >39)
LDL Chol Calc (NIH): 147 mg/dL — ABNORMAL HIGH (ref 0–99)
Triglycerides: 318 mg/dL — ABNORMAL HIGH (ref 0–149)
VLDL Cholesterol Cal: 58 mg/dL — ABNORMAL HIGH (ref 5–40)

## 2019-09-08 LAB — TSH: TSH: 2.66 u[IU]/mL (ref 0.450–4.500)

## 2019-09-08 MED ORDER — ATORVASTATIN CALCIUM 10 MG PO TABS: 10.0000 mg | ORAL_TABLET | Freq: Every day | ORAL | 3 refills | Status: DC

## 2019-09-08 NOTE — Progress Notes (Signed)
Patient agreed to take cholesterol medication. Wants sent to [facility]. Will follow up with cholesterol in 6 months at her follow up scheduled.

## 2019-09-14 ENCOUNTER — Telehealth: Payer: Self-pay

## 2019-09-14 NOTE — Telephone Encounter (Signed)
Called and left VM reminding pt to do her Stool Test.

## 2019-09-22 LAB — FECAL OCCULT BLOOD, IMMUNOCHEMICAL: Fecal Occult Bld: NEGATIVE

## 2019-11-08 ENCOUNTER — Ambulatory Visit
Admission: RE | Admit: 2019-11-08 | Discharge: 2019-11-08 | Disposition: A | Discharge status: Home / Self Care | Payer: 59 | Source: Ambulatory Visit | Attending: Internal Medicine | Admitting: Internal Medicine

## 2019-11-08 ENCOUNTER — Other Ambulatory Visit: Payer: Self-pay

## 2019-11-08 DIAGNOSIS — Z1231 Encounter for screening mammogram for malignant neoplasm of breast: Secondary | ICD-10-CM | POA: Diagnosis not present

## 2019-12-07 ENCOUNTER — Ambulatory Visit: Payer: 59

## 2020-03-07 ENCOUNTER — Other Ambulatory Visit: Payer: Self-pay

## 2020-03-07 ENCOUNTER — Ambulatory Visit: Payer: 59 | Admitting: Internal Medicine

## 2020-03-07 ENCOUNTER — Encounter: Payer: Self-pay | Admitting: Internal Medicine

## 2020-03-07 VITALS — BP 108/68 | HR 85 | Temp 97.8°F | Ht 67.0 in | Wt 162.0 lb

## 2020-03-07 DIAGNOSIS — M238X1 Other internal derangements of right knee: Secondary | ICD-10-CM

## 2020-03-07 DIAGNOSIS — Z23 Encounter for immunization: Secondary | ICD-10-CM

## 2020-03-07 DIAGNOSIS — M238X2 Other internal derangements of left knee: Secondary | ICD-10-CM

## 2020-03-07 DIAGNOSIS — I10 Essential (primary) hypertension: Secondary | ICD-10-CM | POA: Diagnosis not present

## 2020-03-07 DIAGNOSIS — E782 Mixed hyperlipidemia: Secondary | ICD-10-CM

## 2020-03-07 NOTE — Progress Notes (Signed)
Date:  03/07/2020   Name:  Teresa Gutierrez   DOB:  09-29-1965   MRN:  [id number]   Chief Complaint: Hypertension (follow up, Shingrix #2.  )  Hypertension This is a chronic problem. The problem is controlled. Pertinent negatives include no chest pain, headaches, palpitations or shortness of breath. Past treatments include angiotensin blockers. The current treatment provides significant improvement.  Hyperlipidemia This is a chronic problem. The problem is uncontrolled. Pertinent negatives include no chest pain, myalgias or shortness of breath. Current antihyperlipidemic treatment includes statins (lipitor added last visit).    Lab Results  Component Value Date   CREATININE 0.74 09/07/2019   BUN 13 09/07/2019   NA 139 09/07/2019   K 4.7 09/07/2019   CL 101 09/07/2019   CO2 22 09/07/2019   Lab Results  Component Value Date   CHOL 256 (H) 09/07/2019   HDL 51 09/07/2019   LDLCALC 147 (H) 09/07/2019   TRIG 318 (H) 09/07/2019   CHOLHDL 5.0 (H) 09/07/2019   Lab Results  Component Value Date   TSH 2.660 09/07/2019   No results found for: HGBA1C Lab Results  Component Value Date   WBC 9.1 09/07/2019   HGB 15.1 09/07/2019   HCT 44.4 09/07/2019   MCV 95 09/07/2019   PLT 263 09/07/2019   Lab Results  Component Value Date   ALT 17 09/07/2019   AST 19 09/07/2019   ALKPHOS 138 (H) 09/07/2019   BILITOT 0.4 09/07/2019     Review of Systems  Constitutional: Negative for appetite change, fatigue, fever and unexpected weight change.  HENT: Negative for tinnitus and trouble swallowing.   Eyes: Negative for visual disturbance.  Respiratory: Negative for cough, chest tightness and shortness of breath.   Cardiovascular: Negative for chest pain, palpitations and leg swelling.  Gastrointestinal: Negative for abdominal pain.  Genitourinary: Negative for dysuria and hematuria.  Musculoskeletal: Positive for arthralgias. Negative for myalgias.  Neurological: Negative for  tremors, numbness and headaches.  Psychiatric/Behavioral: Negative for dysphoric mood and sleep disturbance. The patient is not nervous/anxious.     Patient Active Problem List   Diagnosis Date Noted  . Generalized anxiety disorder 09/01/2017  . Eczema 04/20/2017  . Back pain of lumbar region with sciatica 04/29/2016  . Cervical disc disorder with radiculopathy 04/29/2016  . Emphysema of lung (HCC) 12/09/2015  . Solitary pulmonary nodule 12/09/2015  . Hyperlipidemia 05/21/2015  . Compulsive tobacco user syndrome 05/21/2015  . Muscle spasms of neck 05/21/2015  . Gastroesophageal reflux disease without esophagitis 05/21/2015  . Essential hypertension 05/21/2015    Allergies  Allergen Reactions  . Wellbutrin [Bupropion]     hostility    Past Surgical History:  Procedure Laterality Date  . ABDOMINAL HYSTERECTOMY  2014   ovaries remain    Social History   Tobacco Use  . Smoking status: Current Every Day Smoker    Packs/day: 0.50    Years: 30.00    Pack years: 15.00    Types: Cigarettes  . Smokeless tobacco: Never Used  Vaping Use  . Vaping Use: Never used  Substance Use Topics  . Alcohol use: Yes    Alcohol/week: 2.0 standard drinks    Types: 2 Standard drinks or equivalent per week  . Drug use: No     Medication list has been reviewed and updated.  Current Meds  Medication Sig  . atorvastatin (LIPITOR) 10 MG tablet Take 1 tablet (10 mg total) by mouth daily.  . Biotin 5000 MCG CAPS Take by  mouth.  . calcium carbonate (TUMS - DOSED IN MG ELEMENTAL CALCIUM) 500 MG chewable tablet Chew 1 tablet by mouth daily.  . Cholecalciferol (VITAMIN D-3) 1000 units CAPS Take by mouth.  . citalopram (CELEXA) 10 MG tablet TAKE 1 TABLET BY MOUTH  DAILY  . cyclobenzaprine (FLEXERIL) 5 MG tablet Take 1 tablet (5 mg total) by mouth 3 (three) times daily as needed for muscle spasms.  . irbesartan (AVAPRO) 300 MG tablet TAKE 1 TABLET BY MOUTH  DAILY  . triamcinolone cream (KENALOG)  0.1 % Apply 1 application topically 2 (two) times daily.  . valACYclovir (VALTREX) 1000 MG tablet Take 1 tablet (1,000 mg total) by mouth 2 (two) times daily.    PHQ 2/9 Scores 03/07/2020 09/07/2019 09/02/2018 09/01/2017  PHQ - 2 Score 0 0 0 0  PHQ- 9 Score 0 0 - -    GAD 7 : Generalized Anxiety Score 03/07/2020 04/20/2017  Nervous, Anxious, on Edge 0 2  Control/stop worrying 0 2  Worry too much - different things 0 2  Trouble relaxing 0 2  Restless 0 2  Easily annoyed or irritable 0 3  Afraid - awful might happen 0 0  Total GAD 7 Score 0 13  Anxiety Difficulty Not difficult at all Extremely difficult    BP Readings from Last 3 Encounters:  03/07/20 108/68  09/07/19 124/78  09/02/18 104/68    Physical Exam Vitals and nursing note reviewed.  Constitutional:      General: She is not in acute distress.    Appearance: She is well-developed.  HENT:     Head: Normocephalic and atraumatic.  Cardiovascular:     Rate and Rhythm: Normal rate and regular rhythm.     Pulses: Normal pulses.     Heart sounds: No murmur heard.   Pulmonary:     Effort: Pulmonary effort is normal. No respiratory distress.  Musculoskeletal:        General: Normal range of motion.     Cervical back: Normal range of motion.     Right knee: Crepitus present. No swelling or effusion.     Left knee: Crepitus present. No swelling or effusion.     Right lower leg: No edema.     Left lower leg: No edema.  Lymphadenopathy:     Cervical: No cervical adenopathy.  Skin:    General: Skin is warm and dry.     Capillary Refill: Capillary refill takes less than 2 seconds.     Findings: No rash.  Neurological:     General: No focal deficit present.     Mental Status: She is alert and oriented to person, place, and time.  Psychiatric:        Behavior: Behavior normal.        Thought Content: Thought content normal.     Wt Readings from Last 3 Encounters:  03/07/20 162 lb (73.5 kg)  09/07/19 159 lb (72.1 kg)    09/02/18 162 lb 3.2 oz (73.6 kg)    BP 108/68 (BP Location: Right Arm, Patient Position: Sitting, Cuff Size: Normal)   Pulse 85   Temp 97.8 F (36.6 C) (Oral)   Ht 5\' 7"  (1.702 m)   Wt 162 lb (73.5 kg)   SpO2 98%   BMI 25.37 kg/m   Assessment and Plan: 1. Essential hypertension Clinically stable exam with well controlled BP on avapro. Tolerating medications without side effects at this time. Pt to continue current regimen and low sodium diet; benefits of regular  exercise as able discussed.  2. Mixed hyperlipidemia Tolerating lipitor well without side effects - Lipid panel - Comprehensive metabolic panel  3. Need for shingles vaccine Second dose today - Varicella-zoster vaccine IM  4. Crepitus of both knee joints Try glucosamine supplements   Partially dictated using Animal nutritionist. Any errors are unintentional.  Bari Edward, MD [facility]  03/07/2020

## 2020-03-08 LAB — COMPREHENSIVE METABOLIC PANEL
ALT: 20 IU/L (ref 0–32)
AST: 17 IU/L (ref 0–40)
Albumin/Globulin Ratio: 2 (ref 1.2–2.2)
Albumin: 4.5 g/dL (ref 3.8–4.9)
Alkaline Phosphatase: 135 IU/L — ABNORMAL HIGH (ref 48–121)
BUN/Creatinine Ratio: 23 (ref 9–23)
BUN: 14 mg/dL (ref 6–24)
Bilirubin Total: 0.3 mg/dL (ref 0.0–1.2)
CO2: 24 mmol/L (ref 20–29)
Calcium: 10.1 mg/dL (ref 8.7–10.2)
Chloride: 100 mmol/L (ref 96–106)
Creatinine, Ser: 0.6 mg/dL (ref 0.57–1.00)
GFR calc Af Amer: 120 mL/min/{1.73_m2} (ref >59)
GFR calc non Af Amer: 104 mL/min/{1.73_m2} (ref >59)
Globulin, Total: 2.2 g/dL (ref 1.5–4.5)
Glucose: 100 mg/dL — ABNORMAL HIGH (ref 65–99)
Potassium: 4.6 mmol/L (ref 3.5–5.2)
Sodium: 138 mmol/L (ref 134–144)
Total Protein: 6.7 g/dL (ref 6.0–8.5)

## 2020-03-08 LAB — LIPID PANEL
Chol/HDL Ratio: 4.8 ratio — ABNORMAL HIGH (ref 0.0–4.4)
Cholesterol, Total: 209 mg/dL — ABNORMAL HIGH (ref 100–199)
HDL: 44 mg/dL (ref >39)
LDL Chol Calc (NIH): 86 mg/dL (ref 0–99)
Triglycerides: 487 mg/dL — ABNORMAL HIGH (ref 0–149)
VLDL Cholesterol Cal: 79 mg/dL — ABNORMAL HIGH (ref 5–40)

## 2020-04-17 ENCOUNTER — Other Ambulatory Visit: Payer: Self-pay | Admitting: Internal Medicine

## 2020-04-17 DIAGNOSIS — E782 Mixed hyperlipidemia: Secondary | ICD-10-CM

## 2020-04-17 DIAGNOSIS — I10 Essential (primary) hypertension: Secondary | ICD-10-CM

## 2020-04-17 DIAGNOSIS — F411 Generalized anxiety disorder: Secondary | ICD-10-CM

## 2020-04-17 MED ORDER — CITALOPRAM HYDROBROMIDE 10 MG PO TABS: 10.0000 mg | ORAL_TABLET | Freq: Every day | ORAL | 2 refills | Status: DC

## 2020-04-17 MED ORDER — IRBESARTAN 300 MG PO TABS: 300.0000 mg | ORAL_TABLET | Freq: Every day | ORAL | 2 refills | Status: DC

## 2020-04-17 MED ORDER — ATORVASTATIN CALCIUM 10 MG PO TABS: 10.0000 mg | ORAL_TABLET | Freq: Every day | ORAL | 3 refills | Status: DC

## 2020-04-17 NOTE — Telephone Encounter (Signed)
Pt has changed pharmacies and needs refills for irbesartan (AVAPRO) 300 MG tablet  citalopram (CELEXA) 10 MG tablet  and  atorvastatin (LIPITOR) 10 MG tablet  Sent to   [facility]) [facility] - [place], [place] - [address] Phone:  [phone]  Fax:  [phone]

## 2020-09-09 ENCOUNTER — Encounter: Payer: 59 | Admitting: Internal Medicine

## 2020-09-09 ENCOUNTER — Encounter: Payer: Self-pay | Admitting: Internal Medicine

## 2020-10-01 DIAGNOSIS — Z90710 Acquired absence of both cervix and uterus: Secondary | ICD-10-CM | POA: Insufficient documentation

## 2020-10-03 ENCOUNTER — Encounter: Payer: Self-pay | Admitting: Internal Medicine

## 2020-10-03 ENCOUNTER — Other Ambulatory Visit: Payer: Self-pay | Admitting: Internal Medicine

## 2020-10-03 ENCOUNTER — Other Ambulatory Visit: Payer: Self-pay

## 2020-10-03 ENCOUNTER — Ambulatory Visit (INDEPENDENT_AMBULATORY_CARE_PROVIDER_SITE_OTHER): Payer: BC Managed Care – PPO | Admitting: Internal Medicine

## 2020-10-03 VITALS — BP 126/82 | HR 90 | Temp 97.9°F | Ht 67.0 in | Wt 166.0 lb

## 2020-10-03 DIAGNOSIS — J439 Emphysema, unspecified: Secondary | ICD-10-CM

## 2020-10-03 DIAGNOSIS — Z0000 Encounter for general adult medical examination without abnormal findings: Secondary | ICD-10-CM

## 2020-10-03 DIAGNOSIS — F17200 Nicotine dependence, unspecified, uncomplicated: Secondary | ICD-10-CM

## 2020-10-03 DIAGNOSIS — E782 Mixed hyperlipidemia: Secondary | ICD-10-CM

## 2020-10-03 DIAGNOSIS — Z1231 Encounter for screening mammogram for malignant neoplasm of breast: Secondary | ICD-10-CM

## 2020-10-03 DIAGNOSIS — I10 Essential (primary) hypertension: Secondary | ICD-10-CM | POA: Diagnosis not present

## 2020-10-03 DIAGNOSIS — Z1211 Encounter for screening for malignant neoplasm of colon: Secondary | ICD-10-CM

## 2020-10-03 DIAGNOSIS — F411 Generalized anxiety disorder: Secondary | ICD-10-CM

## 2020-10-03 DIAGNOSIS — Z1159 Encounter for screening for other viral diseases: Secondary | ICD-10-CM

## 2020-10-03 NOTE — Progress Notes (Signed)
Date:  10/03/2020   Name:  Teresa Gutierrez   DOB:  05-Apr-1966   MRN:  [id number]   Chief Complaint: Annual Exam (Breast Exam. No pap.)  Teresa Gutierrez is a 55 y.o. female who presents today for her Complete Annual Exam. She feels fairly well. She reports exercising - none. She reports she is sleeping poorly. Breast complaints - none.  Mammogram: 10/2019 [facility] DEXA: none Pap smear: discontinued Colonoscopy: FIT done 08/2019  Immunization History  Administered Date(s) Administered  . PFIZER(Purple Top)SARS-COV-2 Vaccination 12/04/2019, 12/25/2019, 07/30/2020  . Tdap 01/18/2019  . Zoster Recombinat (Shingrix) 09/07/2019, 03/07/2020    Hypertension This is a chronic problem. The problem is controlled. Associated symptoms include anxiety. Pertinent negatives include no chest pain, headaches, palpitations or shortness of breath. Past treatments include angiotensin blockers. The current treatment provides significant improvement. There are no compliance problems.  There is no history of kidney disease, CAD/MI or CVA.  Hyperlipidemia The problem is controlled. Factors aggravating her hyperlipidemia include smoking. Pertinent negatives include no chest pain or shortness of breath. Current antihyperlipidemic treatment includes statins.  Anxiety Presents for follow-up visit. Patient reports no chest pain, dizziness, nervous/anxious behavior, palpitations or shortness of breath. Symptoms occur most days. The quality of sleep is good.   Compliance with medications is 76-100%.    Lab Results  Component Value Date   CREATININE 0.60 03/07/2020   BUN 14 03/07/2020   NA 138 03/07/2020   K 4.6 03/07/2020   CL 100 03/07/2020   CO2 24 03/07/2020   Lab Results  Component Value Date   CHOL 209 (H) 03/07/2020   HDL 44 03/07/2020   LDLCALC 86 03/07/2020   TRIG 487 (H) 03/07/2020   CHOLHDL 4.8 (H) 03/07/2020   Lab Results  Component Value Date   TSH 2.660 09/07/2019   No results found  for: HGBA1C Lab Results  Component Value Date   WBC 9.1 09/07/2019   HGB 15.1 09/07/2019   HCT 44.4 09/07/2019   MCV 95 09/07/2019   PLT 263 09/07/2019   Lab Results  Component Value Date   ALT 20 03/07/2020   AST 17 03/07/2020   ALKPHOS 135 (H) 03/07/2020   BILITOT 0.3 03/07/2020     Review of Systems  Constitutional: Negative for chills, fatigue and fever.  HENT: Negative for congestion, hearing loss, tinnitus, trouble swallowing and voice change.   Eyes: Negative for visual disturbance.  Respiratory: Negative for cough, chest tightness, shortness of breath and wheezing.   Cardiovascular: Negative for chest pain, palpitations and leg swelling.  Gastrointestinal: Negative for abdominal pain, constipation, diarrhea and vomiting.  Endocrine: Negative for polydipsia and polyuria.  Genitourinary: Negative for dysuria, frequency, genital sores, vaginal bleeding and vaginal discharge.  Musculoskeletal: Negative for arthralgias, gait problem and joint swelling.  Skin: Negative for color change and rash.  Neurological: Negative for dizziness, tremors, light-headedness and headaches.  Hematological: Negative for adenopathy. Does not bruise/bleed easily.  Psychiatric/Behavioral: Negative for dysphoric mood and sleep disturbance. The patient is not nervous/anxious.     Patient Active Problem List   Diagnosis Date Noted  . S/P total hysterectomy 10/01/2020  . Generalized anxiety disorder 09/01/2017  . Eczema 04/20/2017  . Back pain of lumbar region with sciatica 04/29/2016  . Cervical disc disorder with radiculopathy 04/29/2016  . Emphysema of lung (HCC) 12/09/2015  . Solitary pulmonary nodule 12/09/2015  . Hyperlipidemia 05/21/2015  . Compulsive tobacco user syndrome 05/21/2015  . Muscle spasms of neck 05/21/2015  . Gastroesophageal reflux  disease without esophagitis 05/21/2015  . Essential hypertension 05/21/2015    Allergies  Allergen Reactions  . Wellbutrin [Bupropion]      hostility    Past Surgical History:  Procedure Laterality Date  . ABDOMINAL HYSTERECTOMY  2014   ovaries remain    Social History   Tobacco Use  . Smoking status: Current Every Day Smoker    Packs/day: 0.50    Years: 38.00    Pack years: 19.00    Types: Cigarettes    Start date: 07/16/1982  . Smokeless tobacco: Never Used  Vaping Use  . Vaping Use: Never used  Substance Use Topics  . Alcohol use: Yes    Alcohol/week: 2.0 standard drinks    Types: 2 Standard drinks or equivalent per week  . Drug use: No     Medication list has been reviewed and updated.  Current Meds  Medication Sig  . atorvastatin (LIPITOR) 10 MG tablet Take 1 tablet (10 mg total) by mouth daily.  . Biotin 5000 MCG CAPS Take by mouth.  . calcium carbonate (TUMS - DOSED IN MG ELEMENTAL CALCIUM) 500 MG chewable tablet Chew 1 tablet by mouth daily.  . Cholecalciferol (VITAMIN D-3) 1000 units CAPS Take by mouth.  . citalopram (CELEXA) 10 MG tablet Take 1 tablet (10 mg total) by mouth daily.  . cyclobenzaprine (FLEXERIL) 5 MG tablet Take 1 tablet (5 mg total) by mouth 3 (three) times daily as needed for muscle spasms.  . irbesartan (AVAPRO) 300 MG tablet Take 1 tablet (300 mg total) by mouth daily.  Marland Kitchen triamcinolone cream (KENALOG) 0.1 % Apply 1 application topically 2 (two) times daily.  . valACYclovir (VALTREX) 1000 MG tablet Take 1 tablet (1,000 mg total) by mouth 2 (two) times daily.    PHQ 2/9 Scores 10/03/2020 03/07/2020 09/07/2019 09/02/2018  PHQ - 2 Score 0 0 0 0  PHQ- 9 Score 0 0 0 -    GAD 7 : Generalized Anxiety Score 10/03/2020 03/07/2020 04/20/2017  Nervous, Anxious, on Edge 0 0 2  Control/stop worrying 0 0 2  Worry too much - different things 0 0 2  Trouble relaxing 0 0 2  Restless 0 0 2  Easily annoyed or irritable 0 0 3  Afraid - awful might happen 0 0 0  Total GAD 7 Score 0 0 13  Anxiety Difficulty Not difficult at all Not difficult at all Extremely difficult    BP Readings from  Last 3 Encounters:  10/03/20 126/82  03/07/20 108/68  09/07/19 124/78    Physical Exam Vitals and nursing note reviewed.  Constitutional:      General: She is not in acute distress.    Appearance: She is well-developed.  HENT:     Head: Normocephalic and atraumatic.     Right Ear: External ear normal. There is impacted cerumen.     Left Ear: Tympanic membrane, ear canal and external ear normal.     Nose:     Right Sinus: No maxillary sinus tenderness.     Left Sinus: No maxillary sinus tenderness.  Eyes:     General: No scleral icterus.       Right eye: No discharge.        Left eye: No discharge.     Conjunctiva/sclera: Conjunctivae normal.  Neck:     Thyroid: No thyromegaly.     Vascular: No carotid bruit.  Cardiovascular:     Rate and Rhythm: Normal rate and regular rhythm.     Pulses: Normal  pulses.     Heart sounds: Normal heart sounds.  Pulmonary:     Effort: Pulmonary effort is normal. No respiratory distress.     Breath sounds: No wheezing.  Chest:  Breasts:     Right: No mass, nipple discharge, skin change or tenderness.     Left: No mass, nipple discharge, skin change or tenderness.    Abdominal:     General: Bowel sounds are normal.     Palpations: Abdomen is soft.     Tenderness: There is no abdominal tenderness.  Musculoskeletal:     Cervical back: Normal range of motion. No erythema.     Right lower leg: No edema.     Left lower leg: No edema.  Lymphadenopathy:     Cervical: No cervical adenopathy.  Skin:    General: Skin is warm and dry.     Findings: No rash.     Comments: Multiple benign appearing lesions on back  Neurological:     Mental Status: She is alert and oriented to person, place, and time.     Cranial Nerves: No cranial nerve deficit.     Sensory: No sensory deficit.     Deep Tendon Reflexes: Reflexes are normal and symmetric.  Psychiatric:        Attention and Perception: Attention normal.        Mood and Affect: Mood normal.      Wt Readings from Last 3 Encounters:  10/03/20 166 lb (75.3 kg)  03/07/20 162 lb (73.5 kg)  09/07/19 159 lb (72.1 kg)    BP 126/82   Pulse 90   Temp 97.9 F (36.6 C) (Oral)   Ht 5\' 7"  (1.702 m)   Wt 166 lb (75.3 kg)   SpO2 98%   BMI 26.00 kg/m   Assessment and Plan: 1. Annual physical exam Normal exam Continue healthy diet, recommend regular exercise Recommend Dermatology skin survey  2. Encounter for screening mammogram for breast cancer Scheduled at [facility]  3. Colon cancer screening - Fecal occult blood, imunochemical  4. Need for hepatitis C screening test - Hepatitis C antibody  5. Essential hypertension Clinically stable exam with well controlled BP on avapro. Tolerating medications without side effects at this time. Pt to continue current regimen and low sodium diet; benefits of regular exercise as able discussed. - CBC with Differential/Platelet - Comprehensive metabolic panel - TSH  6. Generalized anxiety disorder Clinically stable on current regimen with good control of symptoms, No SI or HI. Will continue current therapy.  7. Mixed hyperlipidemia Continue statin - has been out for the past week so expect lipids to be elevated slightly - Lipid panel  8. Compulsive tobacco user syndrome Discussed nicotine lozenges to help with quitting May also qualify for LDCT screening next year - discussed  9. Pulmonary emphysema, unspecified emphysema type (HCC) Noted on CT chest in 2020 No symptoms noted - certainly the changes are related to smoking   Partially dictated using Dragon software. Any errors are unintentional.  2021, MD [facility]  10/03/2020

## 2020-10-04 LAB — LIPID PANEL
Chol/HDL Ratio: 5.8 ratio — ABNORMAL HIGH (ref 0.0–4.4)
Cholesterol, Total: 260 mg/dL — ABNORMAL HIGH (ref 100–199)
HDL: 45 mg/dL (ref >39)
LDL Chol Calc (NIH): 107 mg/dL — ABNORMAL HIGH (ref 0–99)
Triglycerides: 621 mg/dL (ref 0–149)
VLDL Cholesterol Cal: 108 mg/dL — ABNORMAL HIGH (ref 5–40)

## 2020-10-04 LAB — CBC WITH DIFFERENTIAL/PLATELET
Basophils Absolute: 0.1 10*3/uL (ref 0.0–0.2)
Basos: 1 %
EOS (ABSOLUTE): 0.1 10*3/uL (ref 0.0–0.4)
Eos: 1 %
Hematocrit: 41 % (ref 34.0–46.6)
Hemoglobin: 13.9 g/dL (ref 11.1–15.9)
Immature Grans (Abs): 0.1 10*3/uL (ref 0.0–0.1)
Immature Granulocytes: 1 %
Lymphocytes Absolute: 2.2 10*3/uL (ref 0.7–3.1)
Lymphs: 22 %
MCH: 31 pg (ref 26.6–33.0)
MCHC: 33.9 g/dL (ref 31.5–35.7)
MCV: 92 fL (ref 79–97)
Monocytes Absolute: 0.6 10*3/uL (ref 0.1–0.9)
Monocytes: 6 %
Neutrophils Absolute: 6.6 10*3/uL (ref 1.4–7.0)
Neutrophils: 69 %
Platelets: 256 10*3/uL (ref 150–450)
RBC: 4.48 x10E6/uL (ref 3.77–5.28)
RDW: 12.9 % (ref 11.7–15.4)
WBC: 9.7 10*3/uL (ref 3.4–10.8)

## 2020-10-04 LAB — COMPREHENSIVE METABOLIC PANEL
ALT: 20 IU/L (ref 0–32)
AST: 19 IU/L (ref 0–40)
Albumin/Globulin Ratio: 2.5 — ABNORMAL HIGH (ref 1.2–2.2)
Albumin: 5 g/dL — ABNORMAL HIGH (ref 3.8–4.9)
Alkaline Phosphatase: 138 IU/L — ABNORMAL HIGH (ref 44–121)
BUN/Creatinine Ratio: 25 — ABNORMAL HIGH (ref 9–23)
BUN: 16 mg/dL (ref 6–24)
Bilirubin Total: <0.2 mg/dL (ref 0.0–1.2)
CO2: 21 mmol/L (ref 20–29)
Calcium: 10 mg/dL (ref 8.7–10.2)
Chloride: 100 mmol/L (ref 96–106)
Creatinine, Ser: 0.65 mg/dL (ref 0.57–1.00)
GFR calc Af Amer: 116 mL/min/{1.73_m2} (ref >59)
GFR calc non Af Amer: 101 mL/min/{1.73_m2} (ref >59)
Globulin, Total: 2 g/dL (ref 1.5–4.5)
Glucose: 103 mg/dL — ABNORMAL HIGH (ref 65–99)
Potassium: 4.6 mmol/L (ref 3.5–5.2)
Sodium: 138 mmol/L (ref 134–144)
Total Protein: 7 g/dL (ref 6.0–8.5)

## 2020-10-04 LAB — HEPATITIS C ANTIBODY: Hep C Virus Ab: <0.1 s/co ratio (ref 0.0–0.9)

## 2020-10-04 LAB — TSH: TSH: 3.19 u[IU]/mL (ref 0.450–4.500)

## 2020-10-07 ENCOUNTER — Other Ambulatory Visit: Payer: Self-pay | Admitting: *Deleted

## 2020-10-07 DIAGNOSIS — I10 Essential (primary) hypertension: Secondary | ICD-10-CM

## 2020-10-07 DIAGNOSIS — F411 Generalized anxiety disorder: Secondary | ICD-10-CM

## 2020-10-07 DIAGNOSIS — E782 Mixed hyperlipidemia: Secondary | ICD-10-CM

## 2020-10-07 MED ORDER — CITALOPRAM HYDROBROMIDE 10 MG PO TABS: 10.0000 mg | ORAL_TABLET | Freq: Every day | ORAL | 1 refills | Status: DC

## 2020-10-07 MED ORDER — IRBESARTAN 300 MG PO TABS: 300.0000 mg | ORAL_TABLET | Freq: Every day | ORAL | 1 refills | Status: DC

## 2020-10-07 MED ORDER — ATORVASTATIN CALCIUM 10 MG PO TABS: 10.0000 mg | ORAL_TABLET | Freq: Every day | ORAL | 3 refills | Status: DC

## 2020-10-07 NOTE — Telephone Encounter (Signed)
Patient is requesting change in pharmacy- still wants 90 days supply of medication. ([facility])

## 2020-10-16 DIAGNOSIS — Z1211 Encounter for screening for malignant neoplasm of colon: Secondary | ICD-10-CM | POA: Diagnosis not present

## 2020-10-17 ENCOUNTER — Telehealth: Payer: Self-pay

## 2020-10-17 NOTE — Telephone Encounter (Signed)
Called and left patient VM reminding patient to complete her fecal occult kit given at last appt. Told her to call back with any questions or concerns.

## 2020-10-19 LAB — FECAL OCCULT BLOOD, IMMUNOCHEMICAL: Fecal Occult Bld: NEGATIVE

## 2020-10-21 ENCOUNTER — Telehealth: Payer: Self-pay | Admitting: *Deleted

## 2020-10-21 NOTE — Telephone Encounter (Signed)
Reviewed lab results and physician's note with the patient. No questions asked.

## 2020-11-11 ENCOUNTER — Other Ambulatory Visit: Payer: Self-pay

## 2020-11-11 ENCOUNTER — Ambulatory Visit
Admission: RE | Admit: 2020-11-11 | Discharge: 2020-11-11 | Disposition: A | Discharge status: Home / Self Care | Payer: BC Managed Care – PPO | Source: Ambulatory Visit | Attending: Internal Medicine | Admitting: Internal Medicine

## 2020-11-11 DIAGNOSIS — Z1231 Encounter for screening mammogram for malignant neoplasm of breast: Secondary | ICD-10-CM | POA: Diagnosis not present

## 2020-12-27 ENCOUNTER — Ambulatory Visit
Admission: EM | Admit: 2020-12-27 | Discharge: 2020-12-27 | Disposition: A | Discharge status: Home / Self Care | Payer: BC Managed Care – PPO | Attending: Family Medicine | Admitting: Family Medicine

## 2020-12-27 ENCOUNTER — Other Ambulatory Visit: Payer: Self-pay

## 2020-12-27 ENCOUNTER — Encounter: Payer: Self-pay | Admitting: Emergency Medicine

## 2020-12-27 DIAGNOSIS — R079 Chest pain, unspecified: Secondary | ICD-10-CM | POA: Diagnosis not present

## 2020-12-27 DIAGNOSIS — R0789 Other chest pain: Secondary | ICD-10-CM | POA: Diagnosis not present

## 2020-12-27 NOTE — ED Provider Notes (Signed)
[facility]    CSN: [id number] Arrival date & time: 12/27/20  1210      History   Chief Complaint Chief Complaint  Patient presents with  . Chest Pain    HPI Teresa Gutierrez is a 55 y.o. female.   HPI   55 year old female here for evaluation of intermittent chest pain.  Patient reports that she has been dealing with chest pain that will come and go every couple of hours, last minutes, and then resolved for the last 2 days.  This is associated with some shortness of breath that is more constant.  Patient reports that the pain is midsternal but does not radiate to her neck or arm, is not associate with sweating, has not had any nausea, has not had any dizziness, or fainting.  Patient is a smoker but she is working on quitting, she does not currently use any HRT, she has not had any surgery or immobility, denies long car rides or air travel.  Patient does report a sedentary lifestyle.    History reviewed. No pertinent past medical history.  Patient Active Problem List   Diagnosis Date Noted  . S/P total hysterectomy 10/01/2020  . Generalized anxiety disorder 09/01/2017  . Eczema 04/20/2017  . Back pain of lumbar region with sciatica 04/29/2016  . Cervical disc disorder with radiculopathy 04/29/2016  . Emphysema of lung (HCC) 12/09/2015  . Solitary pulmonary nodule 12/09/2015  . Hyperlipidemia 05/21/2015  . Compulsive tobacco user syndrome 05/21/2015  . Muscle spasms of neck 05/21/2015  . Gastroesophageal reflux disease without esophagitis 05/21/2015  . Essential hypertension 05/21/2015    Past Surgical History:  Procedure Laterality Date  . ABDOMINAL HYSTERECTOMY  2014   ovaries remain    OB History   No obstetric history on file.      Home Medications    Prior to Admission medications   Medication Sig Start Date End Date Taking? Authorizing Provider  atorvastatin (LIPITOR) 10 MG tablet Take 1 tablet (10 mg total) by mouth daily. 10/07/20  Yes  Reubin Milan, MD  citalopram (CELEXA) 10 MG tablet Take 1 tablet (10 mg total) by mouth daily. 10/07/20  Yes Reubin Milan, MD  irbesartan (AVAPRO) 300 MG tablet Take 1 tablet (300 mg total) by mouth daily. 10/07/20  Yes Reubin Milan, MD  calcium carbonate (TUMS - DOSED IN MG ELEMENTAL CALCIUM) 500 MG chewable tablet Chew 1 tablet by mouth daily.    [provider]  triamcinolone cream (KENALOG) 0.1 % Apply 1 application topically 2 (two) times daily. 04/20/17   Reubin Milan, MD  valACYclovir (VALTREX) 1000 MG tablet Take 1 tablet (1,000 mg total) by mouth 2 (two) times daily. 09/07/19   Reubin Milan, MD    Family History Family History  Problem Relation Age of Onset  . Hypertension Mother   . Heart failure Mother   . Diabetes Mother   . Breast cancer Neg Hx     Social History Social History   Tobacco Use  . Smoking status: Current Every Day Smoker    Packs/day: 0.50    Years: 38.00    Pack years: 19.00    Types: Cigarettes    Start date: 07/16/1982  . Smokeless tobacco: Never Used  Vaping Use  . Vaping Use: Never used  Substance Use Topics  . Alcohol use: Yes    Alcohol/week: 2.0 standard drinks    Types: 2 Standard drinks or equivalent per week  . Drug use: No  Allergies   Wellbutrin [bupropion]   Review of Systems Review of Systems  Constitutional: Negative for diaphoresis, fatigue and fever.  Respiratory: Positive for shortness of breath. Negative for wheezing.   Cardiovascular: Positive for chest pain. Negative for palpitations and leg swelling.  Gastrointestinal: Negative for nausea and vomiting.  Neurological: Negative for dizziness, syncope and headaches.  Hematological: Negative.   Psychiatric/Behavioral: Negative.      Physical Exam Triage Vital Signs ED Triage Vitals  Enc Vitals Group     BP 12/27/20 1222 130/84     Pulse Rate 12/27/20 1222 81     Resp 12/27/20 1222 14     Temp 12/27/20 1222 98.3 F (36.8 C)      Temp Source 12/27/20 1222 Oral     SpO2 12/27/20 1222 100 %     Weight 12/27/20 1219 160 lb (72.6 kg)     Height 12/27/20 1219 5\' 7"  (1.702 m)     Head Circumference --      Peak Flow --      Pain Score 12/27/20 1219 1     Pain Loc --      Pain Edu? --      Excl. in GC? --    No data found.  Updated Vital Signs BP 130/84 (BP Location: Right Arm)   Pulse 81   Temp 98.3 F (36.8 C) (Oral)   Resp 14   Ht 5\' 7"  (1.702 m)   Wt 160 lb (72.6 kg)   SpO2 100%   BMI 25.06 kg/m   Visual Acuity Right Eye Distance:   Left Eye Distance:   Bilateral Distance:    Right Eye Near:   Left Eye Near:    Bilateral Near:     Physical Exam Vitals and nursing note reviewed.  Constitutional:      General: She is not in acute distress.    Appearance: She is well-developed. She is not ill-appearing.  HENT:     Head: Normocephalic and atraumatic.  Eyes:     Extraocular Movements: Extraocular movements intact.     Pupils: Pupils are equal, round, and reactive to light.  Cardiovascular:     Rate and Rhythm: Normal rate and regular rhythm.     Chest Wall: PMI is not displaced.     Heart sounds: Normal heart sounds. No murmur heard. No friction rub.  Pulmonary:     Effort: Pulmonary effort is normal. No tachypnea, accessory muscle usage or respiratory distress.     Breath sounds: No decreased breath sounds, wheezing, rhonchi or rales.  Chest:     Chest wall: No mass, deformity, tenderness, crepitus or edema.  Musculoskeletal:        General: Normal range of motion.     Cervical back: Normal range of motion and neck supple.     Right lower leg: No tenderness. No edema.     Left lower leg: No tenderness. No edema.  Skin:    General: Skin is warm and dry.     Capillary Refill: Capillary refill takes less than 2 seconds.     Findings: No erythema or rash.  Neurological:     General: No focal deficit present.     Mental Status: She is alert and oriented to person, place, and time.   Psychiatric:        Mood and Affect: Mood normal.        Behavior: Behavior normal.      UC Treatments / Results  Labs (all labs  ordered are listed, but only abnormal results are displayed) Labs Reviewed - No data to display  EKG   Radiology No results found.  Procedures Procedures (including critical care time)  Medications Ordered in UC Medications - No data to display  Initial Impression / Assessment and Plan / UC Course  I have reviewed the triage vital signs and the nursing notes.  Pertinent labs & imaging results that were available during my care of the patient were reviewed by me and considered in my medical decision making (see chart for details).   Patient is a very pleasant 55 year old female here for evaluation of intermittent chest pain for last 2 days and ongoing shortness of breath.  Patient does have a history of COPD and is a smoker.  Patient states that the pain will come on every couple of hours, last a few minutes, and then completely resolve and is always midsternal.  There is no radiation associated sweating, nausea, or syncope.  Patient denies any provoking or relieving factors.  Patient's physical exam reveals heart sounds that are S1-S2 and regular, lung sounds are clear to auscultation all fields, there is no tenderness to palpation of the sternum or either side of the chest wall.  Global pulses are 2+.  Patient has no pedal edema and does not complain of any leg heaviness.  She is a smoker with a sedentary lifestyle but she has not had any recent long car travel, air travel, immobilization, surgeries, or history of blood clots.  Patient reports that her mother did have a heart attack in her 23s but no other cardiovascular history.  Patient has had a hysterectomy but does not use any HRT currently.  EKG collected at triage.  EKG shows normal sinus rhythm with ventricular rate of 73, PR interval of 160 ms, QRS duration of 88 ms, QT of 390 ms, and QTc of 429  ms.  There is no ectopy or ST changes.  EKG compared to 12/02/2015 and there are no changes.  Patient's 4PEPS score for PE is -2  Patient reports that she has been also dealing with some reflux symptoms and her symptoms may be coming from intermittent esophageal spasm and her shortness of breath may be resolved esophageal spasm versus mild aspiration.  Patient's physical exam is not consistent with a COPD exacerbation.  I suggest patient treat her reflux symptoms with over-the-counter Prilosec and see if her symptoms improve.  ER precautions reviewed with patient.  Final Clinical Impressions(s) / UC Diagnoses   Final diagnoses:  Atypical chest pain     Discharge Instructions     Your EKG today and physical exam were unremarkable.  As we discussed, you have reflux and esophageal spasm may be responsible for your chest pain and shortness of breath.  Try over-the-counter Prilosec once daily to see if her symptoms resolve.  If you have chest pain that returns and stays, radiates to your jaw or left arm, is associated with sweating, nausea, or dizziness please go to the ER for evaluation.    ED Prescriptions    None     PDMP not reviewed this encounter.   Becky Augusta, NP 12/27/20 1335

## 2020-12-27 NOTE — Discharge Instructions (Addendum)
Your EKG today and physical exam were unremarkable.  As we discussed, you have reflux and esophageal spasm may be responsible for your chest pain and shortness of breath.  Try over-the-counter Prilosec once daily to see if her symptoms resolve.  If you have chest pain that returns and stays, radiates to your jaw or left arm, is associated with sweating, nausea, or dizziness please go to the ER for evaluation.

## 2020-12-27 NOTE — ED Triage Notes (Signed)
Patient c/o chest pain off and on for the past 2 days.  Patient reports SOB.  Patient denies any cold symptoms.

## 2021-01-25 ENCOUNTER — Inpatient Hospital Stay
Admission: EM | Admit: 2021-01-25 | Discharge: 2021-01-28 | DRG: 247 | Disposition: A | Discharge status: Home / Self Care | Payer: BC Managed Care – PPO | Attending: Internal Medicine | Admitting: Internal Medicine

## 2021-01-25 ENCOUNTER — Other Ambulatory Visit: Payer: Self-pay

## 2021-01-25 ENCOUNTER — Emergency Department: Payer: BC Managed Care – PPO

## 2021-01-25 DIAGNOSIS — I10 Essential (primary) hypertension: Secondary | ICD-10-CM | POA: Diagnosis present

## 2021-01-25 DIAGNOSIS — F17210 Nicotine dependence, cigarettes, uncomplicated: Secondary | ICD-10-CM | POA: Diagnosis present

## 2021-01-25 DIAGNOSIS — Z20822 Contact with and (suspected) exposure to covid-19: Secondary | ICD-10-CM | POA: Diagnosis not present

## 2021-01-25 DIAGNOSIS — J439 Emphysema, unspecified: Secondary | ICD-10-CM | POA: Diagnosis present

## 2021-01-25 DIAGNOSIS — K219 Gastro-esophageal reflux disease without esophagitis: Secondary | ICD-10-CM | POA: Diagnosis not present

## 2021-01-25 DIAGNOSIS — Z833 Family history of diabetes mellitus: Secondary | ICD-10-CM

## 2021-01-25 DIAGNOSIS — I208 Other forms of angina pectoris: Secondary | ICD-10-CM | POA: Diagnosis present

## 2021-01-25 DIAGNOSIS — R0789 Other chest pain: Secondary | ICD-10-CM | POA: Diagnosis not present

## 2021-01-25 DIAGNOSIS — I25110 Atherosclerotic heart disease of native coronary artery with unstable angina pectoris: Secondary | ICD-10-CM | POA: Diagnosis not present

## 2021-01-25 DIAGNOSIS — R079 Chest pain, unspecified: Secondary | ICD-10-CM | POA: Diagnosis not present

## 2021-01-25 DIAGNOSIS — Z79899 Other long term (current) drug therapy: Secondary | ICD-10-CM | POA: Diagnosis not present

## 2021-01-25 DIAGNOSIS — F411 Generalized anxiety disorder: Secondary | ICD-10-CM | POA: Diagnosis not present

## 2021-01-25 DIAGNOSIS — E782 Mixed hyperlipidemia: Secondary | ICD-10-CM | POA: Diagnosis not present

## 2021-01-25 DIAGNOSIS — E785 Hyperlipidemia, unspecified: Secondary | ICD-10-CM | POA: Diagnosis present

## 2021-01-25 DIAGNOSIS — Z8249 Family history of ischemic heart disease and other diseases of the circulatory system: Secondary | ICD-10-CM

## 2021-01-25 LAB — COMPREHENSIVE METABOLIC PANEL
ALT: 29 U/L (ref 0–44)
AST: 35 U/L (ref 15–41)
Albumin: 4.6 g/dL (ref 3.5–5.0)
Alkaline Phosphatase: 111 U/L (ref 38–126)
Anion gap: 9 (ref 5–15)
BUN: 21 mg/dL — ABNORMAL HIGH (ref 6–20)
CO2: 24 mmol/L (ref 22–32)
Calcium: 9.5 mg/dL (ref 8.9–10.3)
Chloride: 105 mmol/L (ref 98–111)
Creatinine, Ser: 0.71 mg/dL (ref 0.44–1.00)
GFR, Estimated: >60 mL/min (ref >60)
Glucose, Bld: 106 mg/dL — ABNORMAL HIGH (ref 70–99)
Potassium: 4.5 mmol/L (ref 3.5–5.1)
Sodium: 138 mmol/L (ref 135–145)
Total Bilirubin: 1 mg/dL (ref 0.3–1.2)
Total Protein: 7.3 g/dL (ref 6.5–8.1)

## 2021-01-25 LAB — CBC WITH DIFFERENTIAL/PLATELET
Abs Immature Granulocytes: 0.03 10*3/uL (ref 0.00–0.07)
Basophils Absolute: 0.1 10*3/uL (ref 0.0–0.1)
Basophils Relative: 1 %
Eosinophils Absolute: 0.2 10*3/uL (ref 0.0–0.5)
Eosinophils Relative: 2 %
HCT: 41.8 % (ref 36.0–46.0)
Hemoglobin: 14.4 g/dL (ref 12.0–15.0)
Immature Granulocytes: 0 %
Lymphocytes Relative: 26 %
Lymphs Abs: 2.2 10*3/uL (ref 0.7–4.0)
MCH: 31.2 pg (ref 26.0–34.0)
MCHC: 34.4 g/dL (ref 30.0–36.0)
MCV: 90.5 fL (ref 80.0–100.0)
Monocytes Absolute: 0.7 10*3/uL (ref 0.1–1.0)
Monocytes Relative: 8 %
Neutro Abs: 5.4 10*3/uL (ref 1.7–7.7)
Neutrophils Relative %: 63 %
Platelets: 235 10*3/uL (ref 150–400)
RBC: 4.62 MIL/uL (ref 3.87–5.11)
RDW: 12.5 % (ref 11.5–15.5)
WBC: 8.6 10*3/uL (ref 4.0–10.5)
nRBC: 0 % (ref 0.0–0.2)

## 2021-01-25 LAB — RESP PANEL BY RT-PCR (FLU A&B, COVID) ARPGX2
Influenza A by PCR: NEGATIVE
Influenza B by PCR: NEGATIVE
SARS Coronavirus 2 by RT PCR: NEGATIVE

## 2021-01-25 LAB — LIPASE, BLOOD: Lipase: 28 U/L (ref 11–51)

## 2021-01-25 LAB — TROPONIN I (HIGH SENSITIVITY): Troponin I (High Sensitivity): 26 ng/L — ABNORMAL HIGH (ref <18)

## 2021-01-25 MED ORDER — ACETAMINOPHEN 325 MG PO TABS
650.0000 mg | ORAL_TABLET | ORAL | Status: DC | PRN
  Administered 2021-01-27: 650 mg via ORAL
  Filled 2021-01-25: qty 2

## 2021-01-25 MED ORDER — IRBESARTAN 150 MG PO TABS
300.0000 mg | ORAL_TABLET | Freq: Every day | ORAL | Status: DC
  Administered 2021-01-26 – 2021-01-27 (×2): 300 mg via ORAL
  Filled 2021-01-25 (×2): qty 2

## 2021-01-25 MED ORDER — ASPIRIN 81 MG PO CHEW
324.0000 mg | CHEWABLE_TABLET | Freq: Once | ORAL | Status: AC
  Administered 2021-01-25: 324 mg via ORAL
  Filled 2021-01-25: qty 4

## 2021-01-25 MED ORDER — ONDANSETRON HCL 4 MG/2ML IJ SOLN: 4.0000 mg | Freq: Four times a day (QID) | INTRAMUSCULAR | Status: DC | PRN

## 2021-01-25 MED ORDER — ATORVASTATIN CALCIUM 20 MG PO TABS: 10.0000 mg | ORAL_TABLET | Freq: Every day | ORAL | Status: DC

## 2021-01-25 MED ORDER — CITALOPRAM HYDROBROMIDE 20 MG PO TABS
10.0000 mg | ORAL_TABLET | Freq: Every day | ORAL | Status: DC
  Administered 2021-01-26 – 2021-01-28 (×3): 10 mg via ORAL
  Filled 2021-01-25 (×3): qty 1

## 2021-01-25 NOTE — ED Provider Notes (Signed)
[facility] Emergency Department Provider Note  ____________________________________________  Time seen: Approximately 9:20 PM  I have reviewed the triage vital signs and the nursing notes.   HISTORY  Chief Complaint Chest Pain    HPI Teresa Gutierrez is a 55 y.o. female with a history of anxiety hypertension hyperlipidemia and smoking who comes the ED complaining of chest pain which has been intermittent for the past 3 weeks, worse with exertion, better with rest.  Radiates to the left arm and jaw.  Associated with shortness of breath.  No dizziness or syncope.  Over the last 3 weeks it seems to be getting worse and worse, occurring with progressively less and less exertion.  Yesterday it occurred with even minimal ambulation, and she was not able to get feeling much better even with rest.  Currently she is chest pain-free.      History reviewed. No pertinent past medical history.   Patient Active Problem List   Diagnosis Date Noted  . S/P total hysterectomy 10/01/2020  . Generalized anxiety disorder 09/01/2017  . Eczema 04/20/2017  . Back pain of lumbar region with sciatica 04/29/2016  . Cervical disc disorder with radiculopathy 04/29/2016  . Emphysema of lung (HCC) 12/09/2015  . Solitary pulmonary nodule 12/09/2015  . Hyperlipidemia 05/21/2015  . Compulsive tobacco user syndrome 05/21/2015  . Muscle spasms of neck 05/21/2015  . Gastroesophageal reflux disease without esophagitis 05/21/2015  . Essential hypertension 05/21/2015     Past Surgical History:  Procedure Laterality Date  . ABDOMINAL HYSTERECTOMY  2014   ovaries remain     Prior to Admission medications   Medication Sig Start Date End Date Taking? Authorizing Provider  atorvastatin (LIPITOR) 10 MG tablet Take 1 tablet (10 mg total) by mouth daily. 10/07/20   Reubin Milan, MD  calcium carbonate (TUMS - DOSED IN MG ELEMENTAL CALCIUM) 500 MG chewable tablet Chew 1 tablet by mouth  daily.    [provider]  citalopram (CELEXA) 10 MG tablet Take 1 tablet (10 mg total) by mouth daily. 10/07/20   Reubin Milan, MD  irbesartan (AVAPRO) 300 MG tablet Take 1 tablet (300 mg total) by mouth daily. 10/07/20   Reubin Milan, MD  triamcinolone cream (KENALOG) 0.1 % Apply 1 application topically 2 (two) times daily. 04/20/17   Reubin Milan, MD  valACYclovir (VALTREX) 1000 MG tablet Take 1 tablet (1,000 mg total) by mouth 2 (two) times daily. 09/07/19   Reubin Milan, MD     Allergies Wellbutrin [bupropion]   Family History  Problem Relation Age of Onset  . Hypertension Mother   . Heart failure Mother   . Diabetes Mother   . Breast cancer Neg Hx     Social History Social History   Tobacco Use  . Smoking status: Current Every Day Smoker    Packs/day: 0.25    Years: 38.00    Pack years: 9.50    Types: Cigarettes    Start date: 07/16/1982  . Smokeless tobacco: Never Used  Vaping Use  . Vaping Use: Never used  Substance Use Topics  . Alcohol use: Yes    Alcohol/week: 2.0 standard drinks    Types: 2 Standard drinks or equivalent per week  . Drug use: No    Review of Systems  Constitutional:   No fever or chills.  ENT:   No sore throat. No rhinorrhea. Cardiovascular: Positive chest pain as above without syncope. Respiratory:   No dyspnea or cough. Gastrointestinal:  Negative for abdominal pain, vomiting and diarrhea.  Musculoskeletal:   Negative for focal pain or swelling All other systems reviewed and are negative except as documented above in ROS and HPI.  ____________________________________________   PHYSICAL EXAM:  VITAL SIGNS: ED Triage Vitals  Enc Vitals Group     BP 01/25/21 2020 (!) 201/94     Pulse Rate 01/25/21 2020 68     Resp 01/25/21 2020 15     Temp 01/25/21 2020 97.7 F (36.5 C)     Temp Source 01/25/21 2020 Oral     SpO2 01/25/21 2020 99 %     Weight 01/25/21 2018 160 lb (72.6 kg)     Height 01/25/21 2018  5\' 7"  (1.702 m)     Head Circumference --      Peak Flow --      Pain Score 01/25/21 2018 7     Pain Loc --      Pain Edu? --      Excl. in GC? --     Vital signs reviewed, nursing assessments reviewed.   Constitutional:   Alert and oriented. Non-toxic appearance. Eyes:   Conjunctivae are normal. EOMI. PERRL. ENT      Head:   Normocephalic and atraumatic.      Nose:   Wearing a mask.      Mouth/Throat:   Wearing a mask.      Neck:   No meningismus. Full ROM. Hematological/Lymphatic/Immunilogical:   No cervical lymphadenopathy. Cardiovascular:   RRR. Symmetric bilateral radial and DP pulses.  No murmurs. Cap refill less than 2 seconds. Respiratory:   Normal respiratory effort without tachypnea/retractions. Breath sounds are clear and equal bilaterally. No wheezes/rales/rhonchi. Gastrointestinal:   Soft and nontender. Non distended. There is no CVA tenderness.  No rebound, rigidity, or guarding. Genitourinary:   deferred Musculoskeletal:   Normal range of motion in all extremities. No joint effusions.  No lower extremity tenderness.  No edema. Neurologic:   Normal speech and language.  Motor grossly intact. No acute focal neurologic deficits are appreciated.  Skin:    Skin is warm, dry and intact. No rash noted.  No petechiae, purpura, or bullae.  ____________________________________________    LABS (pertinent positives/negatives) (all labs ordered are listed, but only abnormal results are displayed) Labs Reviewed  RESP PANEL BY RT-PCR (FLU A&B, COVID) ARPGX2  CBC WITH DIFFERENTIAL/PLATELET  COMPREHENSIVE METABOLIC PANEL  LIPASE, BLOOD  TROPONIN I (HIGH SENSITIVITY)   ____________________________________________   EKG  Interpreted by me  Date: 01/25/2021  Rate: 69  Rhythm: normal sinus rhythm  QRS Axis: normal  Intervals: normal  ST/T Wave abnormalities: normal  Conduction Disutrbances: none  Narrative Interpretation:  unremarkable      ____________________________________________    RADIOLOGY  No results found.  ____________________________________________   PROCEDURES Procedures  ____________________________________________  DIFFERENTIAL DIAGNOSIS   Non-STEMI, GERD, dehydration, pneumonia  CLINICAL IMPRESSION / ASSESSMENT AND PLAN / ED COURSE  Medications ordered in the ED: Medications  aspirin chewable tablet 324 mg (324 mg Oral Given 01/25/21 2058)    Pertinent labs & imaging results that were available during my care of the patient were reviewed by me and considered in my medical decision making (see chart for details).  Owen Pagnotta was evaluated in Emergency Department on 01/25/2021 for the symptoms described in the history of present illness. She was evaluated in the context of the global COVID-19 pandemic, which necessitated consideration that the patient might be at risk for infection with the SARS-CoV-2 virus  that causes COVID-19. Institutional protocols and algorithms that pertain to the evaluation of patients at risk for COVID-19 are in a state of rapid change based on information released by regulatory bodies including the CDC and federal and state organizations. These policies and algorithms were followed during the patient's care in the ED.   Patient presents with precordial pain, accelerating over the past 3 weeks.  Currently pain-free.  Will give aspirin, check labs, will need to hospitalize due to elevated risk of symptomatic CAD.  Do not think she requires heparinization if she remains pain-free.  Clinical Course as of 01/25/21 2251  Sat Jan 25, 2021  2250 Chest x-ray viewed and interpreted by me, appears unremarkable.  Radiology report agrees.  Labs unremarkable except for troponin which is slightly elevated.  Will contact hospitalist for further management. [PS]    Clinical Course User Index [PS] Sharman Cheek, MD      ____________________________________________   FINAL CLINICAL IMPRESSION(S) / ED DIAGNOSES    Final diagnoses:  Chest pain with moderate risk for cardiac etiology     ED Discharge Orders    None      Portions of this note were generated with dragon dictation software. Dictation errors may occur despite best attempts at proofreading.   Sharman Cheek, MD 01/25/21 2132

## 2021-01-25 NOTE — ED Triage Notes (Signed)
Pt presents to ER c/o chest pain since last night.  Pt states she has had this same chest pain intermittently for last 3 weeks.  Pt states the pain is in the center of the chest with occasional radiation to left arm or left side of jaw.  Pt has hx of GERD and HTN.  Pt breathing normally, no acute distress noted.

## 2021-01-25 NOTE — H&P (Signed)
History and Physical   Teresa Gutierrez FOY:[id number] DOB: [birth year] DOA: 01/25/2021  PCP: Reubin Milan, MD   Patient coming from: Home  Chief Complaint: Chest pain  HPI: Teresa Gutierrez is a 55 y.o. female with medical history significant of chronic pain, emphysema, tobacco use, hypertension, GERD, anxiety, hyperlipidemia who presents with ongoing chest pain.  Patient reports around 3 weeks of intermittent chest pain.  Pain is located substernally with radiation to her left arm and her jaw (initially the right side and now both sides).  Pain is described as a pressure/stabbing pain that is typically rated as 5-10 out of 10.  Pain typically lasts for 10-15 minutes, but persisted most of the day today.  Pain is worse with exertion and relieved by rest.  It has been getting worse for the past few weeks and requires less and less exertion to trigger the pain.  The pain was initially less frequent but has become daily recently. She is also noted some associated shortness of breath and nausea at times.  She is currently chest pain-free when seen. She denies fevers, chills, abdominal pain, constipation, diarrhea.  ED Course: Vital signs in the ED significant for initial elevated blood pressure which improved spontaneously.  Lab work-up showed CMP with bicarb of 21.  CBC within normal limits.  Lipase normal.  Respiratory panel for flu and COVID-negative.  Initial troponin elevated to 26 with repeat pending.  Chest x-ray with no acute abnormality.  Patient received dose of aspirin in the ED.  Review of Systems: As per HPI otherwise all other systems reviewed and are negative.  History reviewed. No pertinent past medical history.  Past Surgical History:  Procedure Laterality Date  . ABDOMINAL HYSTERECTOMY  2014   ovaries remain   Social History  reports that she has been smoking cigarettes. She started smoking about 38 years ago. She has a 9.50 pack-year smoking history. She has never used  smokeless tobacco. She reports current alcohol use of about 2.0 standard drinks of alcohol per week. She reports that she does not use drugs.  Allergies  Allergen Reactions  . Wellbutrin [Bupropion]     hostility    Family History  Problem Relation Age of Onset  . Hypertension Mother   . Heart failure Mother   . Diabetes Mother   . Breast cancer Neg Hx   Reviewed on admission  Prior to Admission medications   Medication Sig Start Date End Date Taking? Authorizing Provider  atorvastatin (LIPITOR) 10 MG tablet Take 1 tablet (10 mg total) by mouth daily. 10/07/20  Yes Reubin Milan, MD  citalopram (CELEXA) 10 MG tablet Take 1 tablet (10 mg total) by mouth daily. 10/07/20  Yes Reubin Milan, MD  irbesartan (AVAPRO) 300 MG tablet Take 1 tablet (300 mg total) by mouth daily. 10/07/20  Yes Reubin Milan, MD  vitamin B-12 (CYANOCOBALAMIN) 500 MCG tablet Take 500 mcg by mouth daily.   Yes [provider]    Physical Exam: Vitals:   01/25/21 2020 01/25/21 2100 01/25/21 2130 01/25/21 2230  BP: (!) 201/94 (!) 145/96 129/81 (!) 144/87  Pulse: 68 78 73 64  Resp: 15 15 19 15   Temp: 97.7 F (36.5 C)     TempSrc: Oral     SpO2: 99% 97% 96% 99%  Weight:      Height:       Physical Exam Constitutional:      General: She is not in acute distress.    Appearance: Normal  appearance.  HENT:     Head: Normocephalic and atraumatic.     Mouth/Throat:     Mouth: Mucous membranes are moist.     Pharynx: Oropharynx is clear.  Eyes:     Extraocular Movements: Extraocular movements intact.     Pupils: Pupils are equal, round, and reactive to light.  Cardiovascular:     Rate and Rhythm: Normal rate and regular rhythm.     Pulses: Normal pulses.     Heart sounds: Normal heart sounds.  Pulmonary:     Effort: Pulmonary effort is normal. No respiratory distress.     Breath sounds: Normal breath sounds.  Abdominal:     General: Bowel sounds are normal. There is no distension.      Palpations: Abdomen is soft.     Tenderness: There is no abdominal tenderness.  Musculoskeletal:        General: No swelling or deformity.  Skin:    General: Skin is warm and dry.  Neurological:     General: No focal deficit present.     Mental Status: Mental status is at baseline.    Labs on Admission: I have personally reviewed following labs and imaging studies  CBC: Recent Labs  Lab 01/25/21 2027  WBC 8.6  NEUTROABS 5.4  HGB 14.4  HCT 41.8  MCV 90.5  PLT 235    Basic Metabolic Panel: Recent Labs  Lab 01/25/21 2027  NA 138  K 4.5  CL 105  CO2 24  GLUCOSE 106*  BUN 21*  CREATININE 0.71  CALCIUM 9.5    GFR: Estimated Creatinine Clearance: 78.2 mL/min (by C-G formula based on SCr of 0.71 mg/dL).  Liver Function Tests: Recent Labs  Lab 01/25/21 2027  AST 35  ALT 29  ALKPHOS 111  BILITOT 1.0  PROT 7.3  ALBUMIN 4.6    Urine analysis:    Component Value Date/Time   BILIRUBINUR neg 09/07/2019 0914   PROTEINUR Negative 09/07/2019 0914   UROBILINOGEN 0.2 09/07/2019 0914   NITRITE neg 09/07/2019 0914   LEUKOCYTESUR Negative 09/07/2019 0914    Radiological Exams on Admission: DG Chest Portable 1 View  Result Date: 01/25/2021 CLINICAL DATA:  Chest pain EXAM: PORTABLE CHEST 1 VIEW COMPARISON:  12/02/2015 FINDINGS: The heart size and mediastinal contours are within normal limits. Both lungs are clear. The visualized skeletal structures are unremarkable. IMPRESSION: Clear lungs Electronically Signed   By: Deatra Robinson M.D.   On: 01/25/2021 21:45    EKG: Independently reviewed.  Sinus rhythm at 69 bpm.  Assessment/Plan Principal Problem:   Chest pain, rule out acute myocardial infarction Active Problems:   Hyperlipidemia   Essential hypertension   Generalized anxiety disorder  Chest pain, rule out ACS > Story suspicious for cardiac chest pain given worse with exertion and relieved with rest with intermittent radiation to left arm and jaw.  Also with  associated shortness of breath. > Heart score of 5 or 6.  Given aspirin in the ED.  Currently chest pain-free when seen. > Risk factors of hypertension, hyperlipidemia, tobacco use - We will send a message to consult cardiology for consideration of stress testing. - Monitor on telemetry - Continue to trend troponin - As needed EKGs - Check a.m. A1c and lipid panel - Echocardiogram  Hypertension - Continue home irbesartan  Hyperlipidemia - Continue home atorvastatin  Anxiety - Continue home citalopram  DVT prophylaxis: Heparin  Code Status:   Full  Family Communication:  Significant other updated at bedside Disposition Plan:  Patient is from:  Home  Anticipated DC to:  Home  Anticipated DC date:  1 to 2 days  Anticipated DC barriers: None  Consults called:  Cardiology sent secure chat for consult in the morning.   Admission status:  Observation, telemetry   Severity of Illness: The appropriate patient status for this patient is OBSERVATION. Observation status is judged to be reasonable and necessary in order to provide the required intensity of service to ensure the patient's safety. The patient's presenting symptoms, physical exam findings, and initial radiographic and laboratory data in the context of their medical condition is felt to place them at decreased risk for further clinical deterioration. Furthermore, it is anticipated that the patient will be medically stable for discharge from the hospital within 2 midnights of admission. The following factors support the patient status of observation.   " The patient's presenting symptoms include chest pain, shortness of breath. " The physical exam findings include stable physical exam. " The initial radiographic and laboratory data are troponin elevated 26 with repeat pending.  Bicarb 21.   Synetta Fail MD [facility]  How to contact the [facility] Attending or Consulting provider 7A - 7P or covering provider during  after hours 7P -7A, for this patient?   1. Check the care team in [facility] and look for a) attending/consulting [facility] provider listed and b) the [facility] team listed 2. Log into [email or web address] and use 's universal password to access. If you do not have the password, please contact the hospital operator. 3. Locate the [facility] provider you are looking for under [facility] and page to a number that you can be directly reached. 4. If you still have difficulty reaching the provider, please page the [facility] (Director on Call) for the Hospitalists listed on amion for assistance.  01/25/2021, 11:29 PM

## 2021-01-26 ENCOUNTER — Observation Stay
Admit: 2021-01-26 | Discharge: 2021-01-26 | Disposition: A | Discharge status: Home / Self Care | Payer: BC Managed Care – PPO | Attending: Internal Medicine | Admitting: Internal Medicine

## 2021-01-26 DIAGNOSIS — R079 Chest pain, unspecified: Secondary | ICD-10-CM | POA: Diagnosis not present

## 2021-01-26 LAB — ECHOCARDIOGRAM COMPLETE
AR max vel: 2.82 cm2
AV Peak grad: 4.4 mmHg
Ao pk vel: 1.05 m/s
Area-P 1/2: 3.85 cm2
Height: 67 in
S' Lateral: 2.4 cm
Weight: 2560 oz

## 2021-01-26 LAB — TROPONIN I (HIGH SENSITIVITY)
Troponin I (High Sensitivity): 53 ng/L — ABNORMAL HIGH (ref <18)
Troponin I (High Sensitivity): 30 ng/L — ABNORMAL HIGH (ref <18)

## 2021-01-26 LAB — CREATININE, SERUM
Creatinine, Ser: 0.55 mg/dL (ref 0.44–1.00)
GFR, Estimated: >60 mL/min (ref >60)

## 2021-01-26 LAB — LIPID PANEL
Cholesterol: 240 mg/dL — ABNORMAL HIGH (ref 0–200)
HDL: 39 mg/dL — ABNORMAL LOW (ref >40)
LDL Cholesterol: UNDETERMINED mg/dL (ref 0–99)
Total CHOL/HDL Ratio: 6.2 RATIO
Triglycerides: 411 mg/dL — ABNORMAL HIGH (ref <150)
VLDL: UNDETERMINED mg/dL (ref 0–40)

## 2021-01-26 LAB — HIV ANTIBODY (ROUTINE TESTING W REFLEX): HIV Screen 4th Generation wRfx: NONREACTIVE

## 2021-01-26 LAB — LDL CHOLESTEROL, DIRECT: Direct LDL: 138.2 mg/dL — ABNORMAL HIGH (ref 0–99)

## 2021-01-26 MED ORDER — ATORVASTATIN CALCIUM 80 MG PO TABS
80.0000 mg | ORAL_TABLET | Freq: Every day | ORAL | Status: DC
  Administered 2021-01-26 – 2021-01-28 (×3): 80 mg via ORAL
  Filled 2021-01-26 (×2): qty 1
  Filled 2021-01-26: qty 4

## 2021-01-26 MED ORDER — ENOXAPARIN SODIUM 40 MG/0.4ML IJ SOSY: 40.0000 mg | PREFILLED_SYRINGE | INTRAMUSCULAR | Status: DC

## 2021-01-26 MED ORDER — ASPIRIN 325 MG PO TABS
325.0000 mg | ORAL_TABLET | Freq: Every day | ORAL | Status: DC
  Administered 2021-01-26 – 2021-01-27 (×2): 325 mg via ORAL
  Filled 2021-01-26 (×3): qty 1

## 2021-01-26 NOTE — Progress Notes (Signed)
PROGRESS NOTE    Teresa Gutierrez  ZOX:[id number] DOB: [birth year] DOA: 01/25/2021 PCP: Reubin Milan, MD   Brief Narrative: 55 year old with past medical history significant for chronic pain, emphysema, tobacco use, hypertension, anxiety, hyperlipidemia who presents complaining of chest pain.  Patient reports intermittent chest pain for 3 weeks. She report chest pain as substernal,  radiation to the left arm and jaw.  Associated with exertion, relieved by rest. Evaluation in the ED: Morrie Sheldon presented with significantly elevated hypertension, resolved spontaneously.  Troponin elevated, the sinus rhythm no  ST elevation.   Assessment & Plan:   Principal Problem:   Chest pain, rule out acute myocardial infarction Active Problems:   Hyperlipidemia   Essential hypertension   Generalized anxiety disorder   1-Chest Pain, positive troponin:  Presents with chest pain, multiple risk factors, Troponin elevated: 26--53-30 Cardiology consulted.  Awaiting ECHO to determine Stress test vs cath.  No need to start Heparin Gtt per cardiology.  Continue with aspirin, statins.   2-HTN; Continue with Irbesartan.   3-HLD;  Triglyceride 400. Discussed diet with patient. Continue with statins, increase dose to 80 mg.   4-Anxiety: Continue with Celexa.          Estimated body mass index is 25.06 kg/m as calculated from the following:   Height as of this encounter: 5\' 7"  (1.702 m).   Weight as of this encounter: 72.6 kg.   DVT prophylaxis: Lovenox Code Status: Full code Family Communication: Significant other at bedside Disposition Plan:  Status is: Observation  The patient remains OBS appropriate and will d/c before 2 midnights.  Dispo: The patient is from: Home              Anticipated d/c is to: Home              Patient currently is not medically stable to d/c.   Difficult to place patient No        Consultants:   Cardiology   Procedures:    ECHO  Antimicrobials:    Subjective: She is alert, denies chest pain currently. Report chest pain at rest or doing thing at home.  Denies pleuritic chest pain or associated with meals.    Objective: Vitals:   01/25/21 2230 01/25/21 2300 01/26/21 0321 01/26/21 0646  BP: (!) 144/87 (!) 141/87 (!) 144/85 (!) 143/86  Pulse: 64 64 67 72  Resp: 15 14 (!) 23 16  Temp:      TempSrc:      SpO2: 99% 98% 100% 97%  Weight:      Height:       No intake or output data in the 24 hours ending 01/26/21 0807 Filed Weights   01/25/21 2018  Weight: 72.6 kg    Examination:  General exam: Appears calm and comfortable  Respiratory system: Clear to auscultation. Respiratory effort normal. Cardiovascular system: S1 & S2 heard, RRR. No JVD, murmurs, rubs, gallops or clicks. No pedal edema. Gastrointestinal system: Abdomen is nondistended, soft and nontender. No organomegaly or masses felt. Normal bowel sounds heard. Central nervous system: Alert and oriented. No focal neurological deficits. Extremities: Symmetric 5 x 5 power. Skin: No rashes, lesions or ulcers Psychiatry: Judgement and insight appear normal. Mood & affect appropriate.     Data Reviewed: I have personally reviewed following labs and imaging studies  CBC: Recent Labs  Lab 01/25/21 2027  WBC 8.6  NEUTROABS 5.4  HGB 14.4  HCT 41.8  MCV 90.5  PLT 235   Basic Metabolic Panel:  Recent Labs  Lab 01/25/21 0321 01/25/21 2027  NA  --  138  K  --  4.5  CL  --  105  CO2  --  24  GLUCOSE  --  106*  BUN  --  21*  CREATININE 0.55 0.71  CALCIUM  --  9.5   GFR: Estimated Creatinine Clearance: 78.2 mL/min (by C-G formula based on SCr of 0.71 mg/dL). Liver Function Tests: Recent Labs  Lab 01/25/21 2027  AST 35  ALT 29  ALKPHOS 111  BILITOT 1.0  PROT 7.3  ALBUMIN 4.6   Recent Labs  Lab 01/25/21 2027  LIPASE 28   No results for input(s): AMMONIA in the last 168 hours. Coagulation Profile: No results for  input(s): INR, PROTIME in the last 168 hours. Cardiac Enzymes: No results for input(s): CKTOTAL, CKMB, CKMBINDEX, TROPONINI in the last 168 hours. BNP (last 3 results) No results for input(s): PROBNP in the last 8760 hours. HbA1C: No results for input(s): HGBA1C in the last 72 hours. CBG: No results for input(s): GLUCAP in the last 168 hours. Lipid Profile: Recent Labs    01/26/21 0321  CHOL 240*  HDL 39*  LDLCALC UNABLE TO CALCULATE IF TRIGLYCERIDE OVER 400 mg/dL  TRIG 258*  CHOLHDL 6.2   Thyroid Function Tests: No results for input(s): TSH, T4TOTAL, FREET4, T3FREE, THYROIDAB in the last 72 hours. Anemia Panel: No results for input(s): VITAMINB12, FOLATE, FERRITIN, TIBC, IRON, RETICCTPCT in the last 72 hours. Sepsis Labs: No results for input(s): PROCALCITON, LATICACIDVEN in the last 168 hours.  Recent Results (from the past 240 hour(s))  Resp Panel by RT-PCR (Flu A&B, Covid) Nasopharyngeal Swab     Status: None   Collection Time: 01/25/21  8:48 PM   Specimen: Nasopharyngeal Swab; Nasopharyngeal(NP) swabs in vial transport medium  Result Value Ref Range Status   SARS Coronavirus 2 by RT PCR NEGATIVE NEGATIVE Final    Comment: (NOTE) SARS-CoV-2 target nucleic acids are NOT DETECTED.  The SARS-CoV-2 RNA is generally detectable in upper respiratory specimens during the acute phase of infection. The lowest concentration of SARS-CoV-2 viral copies this assay can detect is 138 copies/mL. A negative result does not preclude SARS-Cov-2 infection and should not be used as the sole basis for treatment or other patient management decisions. A negative result may occur with  improper specimen collection/handling, submission of specimen other than nasopharyngeal swab, presence of viral mutation(s) within the areas targeted by this assay, and inadequate number of viral copies(<138 copies/mL). A negative result must be combined with clinical observations, patient history, and  epidemiological information. The expected result is Negative.  Fact Sheet for Patients:  BloggerCourse.com  Fact Sheet for Healthcare Providers:  SeriousBroker.it  This test is no t yet approved or cleared by the [place] FDA and  has been authorized for detection and/or diagnosis of SARS-CoV-2 by FDA under an Emergency Use Authorization (EUA). This EUA will remain  in effect (meaning this test can be used) for the duration of the COVID-19 declaration under Section 564(b)(1) of the Act, 21 U.S.C.section 360bbb-3(b)(1), unless the authorization is terminated  or revoked sooner.       Influenza A by PCR NEGATIVE NEGATIVE Final   Influenza B by PCR NEGATIVE NEGATIVE Final    Comment: (NOTE) The Xpert Xpress SARS-CoV-2/FLU/RSV plus assay is intended as an aid in the diagnosis of influenza from Nasopharyngeal swab specimens and should not be used as a sole basis for treatment. Nasal washings and aspirates are unacceptable for  Xpert Xpress SARS-CoV-2/FLU/RSV testing.  Fact Sheet for Patients: BloggerCourse.com  Fact Sheet for Healthcare Providers: SeriousBroker.it  This test is not yet approved or cleared by the [place] FDA and has been authorized for detection and/or diagnosis of SARS-CoV-2 by FDA under an Emergency Use Authorization (EUA). This EUA will remain in effect (meaning this test can be used) for the duration of the COVID-19 declaration under Section 564(b)(1) of the Act, 21 U.S.C. section 360bbb-3(b)(1), unless the authorization is terminated or revoked.  Performed at [facility], [address], [place], [place] [address]          Radiology Studies: DG Chest Portable 1 View  Result Date: 01/25/2021 CLINICAL DATA:  Chest pain EXAM: PORTABLE CHEST 1 VIEW COMPARISON:  12/02/2015 FINDINGS: The heart size and mediastinal contours are within  normal limits. Both lungs are clear. The visualized skeletal structures are unremarkable. IMPRESSION: Clear lungs Electronically Signed   By: Deatra Robinson M.D.   On: 01/25/2021 21:45        Scheduled Meds: . atorvastatin  10 mg Oral Daily  . citalopram  10 mg Oral Daily  . irbesartan  300 mg Oral Daily   Continuous Infusions:   LOS: 0 days    Time spent: 35 minutes.     Alba Cory, MD [facility]   If 7PM-7AM, please contact night-coverage [email or web address]  01/26/2021, 8:07 AM

## 2021-01-26 NOTE — ED Notes (Signed)
Echo at bedside

## 2021-01-26 NOTE — Consult Note (Signed)
Cardiology Consultation Note    Patient ID: Teresa Gutierrez, MRN: [id number], DOB/AGE: July 30, 1966 55 y.o. Admit date: 01/25/2021   Date of Consult: 01/26/2021 Primary Physician: Reubin Milan, MD Primary Cardiologist: none  Chief Complaint: chest pain Reason for Consultation: chest pain Requesting MD: Dr. Sunnie Nielsen  HPI: Meghann Landing is a 55 y.o. female with history of palpitations, shortness of breath treated as an outpatient with Avapro.  She has a history of hyperlipidemia on Lipitor.  She is a smoker.  She has a history of chronic pain, emphysema as well.  She presented to the ER after approximately 3 weeks of intermittent chest pain.  These are substernal radiating to her jaw.  It is originated in her right side now both sides.  Described as stabbing pain.  Last several minutes.  It is worse with exertion per her report.  In the emergency room she was hypertensive.  Chest x-ray was unremarkable.  Her troponin was mildly elevated but flat with troponins of 26, 53 and 30.  Her triglycerides are 411 with an HDL of 39.  Renal function is normal.  CBC was normal.  EKG showed sinus rhythm with no ischemic changes.  Patient is currently treated with aspirin, atorvastatin 80 mg daily, irbesartan 300 mg daily.  She states the pain has gradually increased in frequency and severity.  History reviewed. No pertinent past medical history.    Surgical History:  Past Surgical History:  Procedure Laterality Date  . ABDOMINAL HYSTERECTOMY  2014   ovaries remain     Home Meds: Prior to Admission medications   Medication Sig Start Date End Date Taking? Authorizing Provider  atorvastatin (LIPITOR) 10 MG tablet Take 1 tablet (10 mg total) by mouth daily. 10/07/20  Yes Reubin Milan, MD  citalopram (CELEXA) 10 MG tablet Take 1 tablet (10 mg total) by mouth daily. 10/07/20  Yes Reubin Milan, MD  irbesartan (AVAPRO) 300 MG tablet Take 1 tablet (300 mg total) by mouth daily. 10/07/20  Yes  Reubin Milan, MD  vitamin B-12 (CYANOCOBALAMIN) 500 MCG tablet Take 500 mcg by mouth daily.   Yes [provider]    Inpatient Medications:  . atorvastatin  10 mg Oral Daily  . citalopram  10 mg Oral Daily  . irbesartan  300 mg Oral Daily     Allergies:  Allergies  Allergen Reactions  . Wellbutrin [Bupropion]     hostility    Social History   Socioeconomic History  . Marital status: Married    Spouse name: Not on file  . Number of children: Not on file  . Years of education: Not on file  . Highest education level: Not on file  Occupational History  . Not on file  Tobacco Use  . Smoking status: Current Every Day Smoker    Packs/day: 0.25    Years: 38.00    Pack years: 9.50    Types: Cigarettes    Start date: 07/16/1982  . Smokeless tobacco: Never Used  Vaping Use  . Vaping Use: Never used  Substance and Sexual Activity  . Alcohol use: Yes    Alcohol/week: 2.0 standard drinks    Types: 2 Standard drinks or equivalent per week  . Drug use: No  . Sexual activity: Yes  Other Topics Concern  . Not on file  Social History Narrative  . Not on file   Social Determinants of Health   Financial Resource Strain: Not on file  Food Insecurity: Not on file  Transportation Needs: Not on file  Physical Activity: Not on file  Stress: Not on file  Social Connections: Not on file  Intimate Partner Violence: Not on file     Family History  Problem Relation Age of Onset  . Hypertension Mother   . Heart failure Mother   . Diabetes Mother   . Breast cancer Neg Hx      Review of Systems: A 12-system review of systems was performed and is negative except as noted in the HPI.  Labs: No results for input(s): CKTOTAL, CKMB, TROPONINI in the last 72 hours. Lab Results  Component Value Date   WBC 8.6 01/25/2021   HGB 14.4 01/25/2021   HCT 41.8 01/25/2021   MCV 90.5 01/25/2021   PLT 235 01/25/2021    Recent Labs  Lab 01/25/21 2027  NA 138  K 4.5  CL  105  CO2 24  BUN 21*  CREATININE 0.71  CALCIUM 9.5  PROT 7.3  BILITOT 1.0  ALKPHOS 111  ALT 29  AST 35  GLUCOSE 106*   Lab Results  Component Value Date   CHOL 240 (H) 01/26/2021   HDL 39 (L) 01/26/2021   LDLCALC UNABLE TO CALCULATE IF TRIGLYCERIDE OVER 400 mg/dL 69/62/9528   TRIG 413 (H) 01/26/2021   No results found for: DDIMER  Radiology/Studies:  DG Chest Portable 1 View  Result Date: 01/25/2021 CLINICAL DATA:  Chest pain EXAM: PORTABLE CHEST 1 VIEW COMPARISON:  12/02/2015 FINDINGS: The heart size and mediastinal contours are within normal limits. Both lungs are clear. The visualized skeletal structures are unremarkable. IMPRESSION: Clear lungs Electronically Signed   By: Deatra Robinson M.D.   On: 01/25/2021 21:45    Wt Readings from Last 3 Encounters:  01/25/21 72.6 kg  12/27/20 72.6 kg  10/03/20 75.3 kg    EKG: Normal sinus rhythm with no ischemia.  Physical Exam:  Blood pressure (!) 141/87, pulse 62, temperature 97.7 F (36.5 C), temperature source Oral, resp. rate 17, height 5\' 7"  (1.702 m), weight 72.6 kg, SpO2 97 %. Body mass index is 25.06 kg/m. General: Well developed, well nourished, in no acute distress. Head: Normocephalic, atraumatic, sclera non-icteric, no xanthomas, nares are without discharge.  Neck: Negative for carotid bruits. JVD not elevated. Lungs: Clear bilaterally to auscultation without wheezes, rales, or rhonchi. Breathing is unlabored. Heart: RRR with S1 S2. No murmurs, rubs, or gallops appreciated. Abdomen: Soft, non-tender, non-distended with normoactive bowel sounds. No hepatomegaly. No rebound/guarding. No obvious abdominal masses. Msk:  Strength and tone appear normal for age. Extremities: No clubbing or cyanosis. No edema.  Distal pedal pulses are 2+ and equal bilaterally. Neuro: Alert and oriented X 3. No facial asymmetry. No focal deficit. Moves all extremities spontaneously. Psych:  Responds to questions appropriately with a normal  affect.     Assessment and Plan  55 year old female with history of palpitations, chronic tobacco abuse, hypertension, hyperlipidemia who presented to the emergency room complaints of chest pain.  She is ruled out for myocardial infarction.  She has mildly elevated serum troponins however they were flat.  EKG showed no ischemia.  She is currently pain-free.  Risk factors include hyperlipidemia hypertension and tobacco abuse.  1.  Chest pain-flat troponins.  Does not appear to suggest non-STEMI.  EKG unremarkable.  We will proceed with an echocardiogram to evaluate LV function to evaluate for wall motion abnormality.  We will also tentatively plan for a Lexiscan sestamibi to evaluate for ischemia.  If the patient develops further instability  where the echo shows wall motion abnormality would proceed with left heart cath.  If functional study is unremarkable would proceed with left heart cath.  We will continue with her Avapro and atorvastatin and aspirin at present.  Would not start heparin at present.  2.  Hypertension-continue with irbesartan.  3.  Hyperlipidemia-continue with atorvastatin at 80 mg daily.  4.  Tobacco abuse-patient was counseled.  She states she is attempting to quit.  Signed, Dalia Heading MD 01/26/2021, 8:26 AM Pager: [phone]

## 2021-01-26 NOTE — ED Notes (Signed)
Received report from Ashley, RN

## 2021-01-27 ENCOUNTER — Encounter
Admission: EM | Disposition: A | Discharge status: Home / Self Care | Payer: Self-pay | Source: Home / Self Care | Attending: Internal Medicine

## 2021-01-27 DIAGNOSIS — R079 Chest pain, unspecified: Secondary | ICD-10-CM | POA: Diagnosis not present

## 2021-01-27 HISTORY — PX: LEFT HEART CATH AND CORONARY ANGIOGRAPHY: CATH118249

## 2021-01-27 HISTORY — PX: CORONARY STENT INTERVENTION: CATH118234

## 2021-01-27 LAB — POCT ACTIVATED CLOTTING TIME
Activated Clotting Time: 392 seconds
Activated Clotting Time: 446 seconds

## 2021-01-27 LAB — TROPONIN I (HIGH SENSITIVITY)
Troponin I (High Sensitivity): 18 ng/L — ABNORMAL HIGH (ref <18)
Troponin I (High Sensitivity): 22 ng/L — ABNORMAL HIGH (ref <18)
Troponin I (High Sensitivity): 24 ng/L — ABNORMAL HIGH (ref <18)

## 2021-01-27 LAB — HEMOGLOBIN A1C
Hgb A1c MFr Bld: 6 % — ABNORMAL HIGH (ref 4.8–5.6)
Mean Plasma Glucose: 126 mg/dL

## 2021-01-27 SURGERY — LEFT HEART CATH AND CORONARY ANGIOGRAPHY
Anesthesia: Moderate Sedation

## 2021-01-27 MED ORDER — ASPIRIN 81 MG PO CHEW
CHEWABLE_TABLET | ORAL | Status: AC
  Filled 2021-01-27: qty 1

## 2021-01-27 MED ORDER — TICAGRELOR 90 MG PO TABS
ORAL_TABLET | ORAL | Status: DC | PRN
  Administered 2021-01-27: 180 mg via ORAL

## 2021-01-27 MED ORDER — SODIUM CHLORIDE 0.9 % WEIGHT BASED INFUSION: 1.0000 mL/kg/h | INTRAVENOUS | Status: DC

## 2021-01-27 MED ORDER — ASPIRIN 81 MG PO CHEW
CHEWABLE_TABLET | ORAL | Status: DC | PRN
  Administered 2021-01-27: 243 mg via ORAL

## 2021-01-27 MED ORDER — SODIUM CHLORIDE 0.9% FLUSH
3.0000 mL | Freq: Two times a day (BID) | INTRAVENOUS | Status: DC
  Administered 2021-01-27: 3 mL via INTRAVENOUS

## 2021-01-27 MED ORDER — ASPIRIN 81 MG PO CHEW
CHEWABLE_TABLET | ORAL | Status: AC
  Filled 2021-01-27: qty 3

## 2021-01-27 MED ORDER — SODIUM CHLORIDE 0.9 % IV SOLN: 250.0000 mL | INTRAVENOUS | Status: DC | PRN

## 2021-01-27 MED ORDER — NITROGLYCERIN 0.4 MG SL SUBL: 0.4000 mg | SUBLINGUAL_TABLET | SUBLINGUAL | Status: DC | PRN

## 2021-01-27 MED ORDER — SODIUM CHLORIDE 0.9% FLUSH: 3.0000 mL | INTRAVENOUS | Status: DC | PRN

## 2021-01-27 MED ORDER — MIDAZOLAM HCL 2 MG/2ML IJ SOLN
INTRAMUSCULAR | Status: AC
  Filled 2021-01-27: qty 2

## 2021-01-27 MED ORDER — ASPIRIN 81 MG PO CHEW
81.0000 mg | CHEWABLE_TABLET | Freq: Every day | ORAL | Status: DC
  Administered 2021-01-27 – 2021-01-28 (×2): 81 mg via ORAL
  Filled 2021-01-27: qty 1

## 2021-01-27 MED ORDER — TICAGRELOR 90 MG PO TABS
90.0000 mg | ORAL_TABLET | Freq: Two times a day (BID) | ORAL | Status: DC
  Administered 2021-01-27 – 2021-01-28 (×2): 90 mg via ORAL
  Filled 2021-01-27 (×2): qty 1

## 2021-01-27 MED ORDER — SODIUM CHLORIDE 0.9 % WEIGHT BASED INFUSION
1.0000 mL/kg/h | INTRAVENOUS | Status: AC
  Administered 2021-01-27: 1 mL/kg/h via INTRAVENOUS

## 2021-01-27 MED ORDER — ACETAMINOPHEN 325 MG PO TABS: 650.0000 mg | ORAL_TABLET | ORAL | Status: DC | PRN

## 2021-01-27 MED ORDER — HYDRALAZINE HCL 20 MG/ML IJ SOLN: 10.0000 mg | Freq: Four times a day (QID) | INTRAMUSCULAR | Status: DC | PRN

## 2021-01-27 MED ORDER — HEPARIN SODIUM (PORCINE) 1000 UNIT/ML IJ SOLN
INTRAMUSCULAR | Status: AC
  Filled 2021-01-27: qty 1

## 2021-01-27 MED ORDER — MORPHINE SULFATE (PF) 2 MG/ML IV SOLN: 1.0000 mg | INTRAVENOUS | Status: DC | PRN

## 2021-01-27 MED ORDER — FENTANYL CITRATE (PF) 100 MCG/2ML IJ SOLN
INTRAMUSCULAR | Status: AC
  Filled 2021-01-27: qty 2

## 2021-01-27 MED ORDER — NITROGLYCERIN 0.4 MG SL SUBL
SUBLINGUAL_TABLET | SUBLINGUAL | Status: AC
  Administered 2021-01-27: 0.4 mg via SUBLINGUAL
  Filled 2021-01-27: qty 1

## 2021-01-27 MED ORDER — ONDANSETRON HCL 4 MG/2ML IJ SOLN: 4.0000 mg | Freq: Four times a day (QID) | INTRAMUSCULAR | Status: DC | PRN

## 2021-01-27 MED ORDER — HYDRALAZINE HCL 20 MG/ML IJ SOLN: 10.0000 mg | INTRAMUSCULAR | Status: AC | PRN

## 2021-01-27 MED ORDER — IOHEXOL 300 MG/ML  SOLN
INTRAMUSCULAR | Status: DC | PRN
  Administered 2021-01-27: 273 mL

## 2021-01-27 MED ORDER — NITROGLYCERIN 1 MG/10 ML FOR IR/CATH LAB
INTRA_ARTERIAL | Status: AC
  Filled 2021-01-27: qty 10

## 2021-01-27 MED ORDER — SODIUM CHLORIDE 0.9 % WEIGHT BASED INFUSION
3.0000 mL/kg/h | INTRAVENOUS | Status: DC
  Administered 2021-01-27: 3 mL/kg/h via INTRAVENOUS

## 2021-01-27 MED ORDER — FENTANYL CITRATE (PF) 100 MCG/2ML IJ SOLN
INTRAMUSCULAR | Status: DC | PRN
  Administered 2021-01-27 (×2): 25 ug via INTRAVENOUS

## 2021-01-27 MED ORDER — VERAPAMIL HCL 2.5 MG/ML IV SOLN
INTRAVENOUS | Status: AC
  Filled 2021-01-27: qty 2

## 2021-01-27 MED ORDER — SODIUM CHLORIDE 0.9% FLUSH
3.0000 mL | Freq: Two times a day (BID) | INTRAVENOUS | Status: DC
  Administered 2021-01-28: 3 mL via INTRAVENOUS

## 2021-01-27 MED ORDER — ASPIRIN 81 MG PO CHEW: 81.0000 mg | CHEWABLE_TABLET | ORAL | Status: DC

## 2021-01-27 MED ORDER — HEPARIN SODIUM (PORCINE) 1000 UNIT/ML IJ SOLN
INTRAMUSCULAR | Status: DC | PRN
  Administered 2021-01-27: 7500 [IU] via INTRAVENOUS
  Administered 2021-01-27: 3500 [IU] via INTRAVENOUS

## 2021-01-27 MED ORDER — HEPARIN (PORCINE) IN NACL 1000-0.9 UT/500ML-% IV SOLN
INTRAVENOUS | Status: AC
  Filled 2021-01-27: qty 1000

## 2021-01-27 MED ORDER — MIDAZOLAM HCL 2 MG/2ML IJ SOLN
INTRAMUSCULAR | Status: DC | PRN
  Administered 2021-01-27 (×2): 1 mg via INTRAVENOUS

## 2021-01-27 MED ORDER — LABETALOL HCL 5 MG/ML IV SOLN: 10.0000 mg | INTRAVENOUS | Status: AC | PRN

## 2021-01-27 MED ORDER — NITROGLYCERIN 1 MG/10 ML FOR IR/CATH LAB
INTRA_ARTERIAL | Status: DC | PRN
  Administered 2021-01-27 (×2): 200 ug via INTRACORONARY

## 2021-01-27 MED ORDER — TICAGRELOR 90 MG PO TABS
ORAL_TABLET | ORAL | Status: AC
  Filled 2021-01-27: qty 2

## 2021-01-27 MED ORDER — METOPROLOL TARTRATE 25 MG PO TABS
25.0000 mg | ORAL_TABLET | Freq: Two times a day (BID) | ORAL | Status: DC
  Administered 2021-01-27 – 2021-01-28 (×2): 25 mg via ORAL
  Filled 2021-01-27 (×2): qty 1

## 2021-01-27 MED ORDER — VERAPAMIL HCL 2.5 MG/ML IV SOLN
INTRAVENOUS | Status: DC | PRN
  Administered 2021-01-27: 2.5 mg via INTRA_ARTERIAL

## 2021-01-27 MED ORDER — HEPARIN (PORCINE) IN NACL 1000-0.9 UT/500ML-% IV SOLN
INTRAVENOUS | Status: DC | PRN
  Administered 2021-01-27 (×3): 500 mL

## 2021-01-27 SURGICAL SUPPLY — 23 items
BALLN TREK RX 2.25X15 (BALLOONS) ×2
BALLN ~~LOC~~ TREK RX 2.5X20 (BALLOONS) ×2
BALLOON TREK RX 2.25X15 (BALLOONS) ×1 IMPLANT
BALLOON ~~LOC~~ TREK RX 2.5X20 (BALLOONS) ×1 IMPLANT
CATH INFINITI 5 FR JL3.5 (CATHETERS) ×2 IMPLANT
CATH INFINITI 5FR ANG PIGTAIL (CATHETERS) ×2 IMPLANT
CATH INFINITI JR4 5F (CATHETERS) ×2 IMPLANT
CATH VISTA GUIDE 6FR XB3.5 (CATHETERS) ×2 IMPLANT
DEVICE RAD TR BAND REGULAR (VASCULAR PRODUCTS) ×2 IMPLANT
DRAPE BRACHIAL (DRAPES) ×2 IMPLANT
GLIDESHEATH SLEND SS 6F .021 (SHEATH) ×2 IMPLANT
GUIDEWIRE INQWIRE 1.5J.035X260 (WIRE) ×1 IMPLANT
INQWIRE 1.5J .035X260CM (WIRE) ×2
KIT ENCORE 26 ADVANTAGE (KITS) ×2 IMPLANT
PACK CARDIAC CATH (CUSTOM PROCEDURE TRAY) ×2 IMPLANT
PROTECTION STATION PRESSURIZED (MISCELLANEOUS) ×2
SET ATX SIMPLICITY (MISCELLANEOUS) ×2 IMPLANT
STATION PROTECTION PRESSURIZED (MISCELLANEOUS) ×1 IMPLANT
STENT RESOLUTE ONYX 2.25X12 (Permanent Stent) ×2 IMPLANT
STENT RESOLUTE ONYX 2.25X15 (Permanent Stent) ×2 IMPLANT
STENT RESOLUTE ONYX 2.25X8 (Permanent Stent) IMPLANT
STENT RESOLUTE ONYX 2.5X15 (Permanent Stent) ×2 IMPLANT
WIRE ASAHI PROWATER 180CM (WIRE) ×2 IMPLANT

## 2021-01-27 NOTE — Progress Notes (Signed)
Pt complaining of 10/10 chest pain. MD ordered stat EKG and subligual nitro. BP 209/93, HR 69. EKG performed and results given to Dr Sunnie Nielsen. No new EKG changes per MD. Post nitro vitals BP 151/93, HR 93. Pt states 0/10 pain. PT scheduled for heart cath this morning. Will continue to monitor.

## 2021-01-27 NOTE — Progress Notes (Signed)
PROGRESS NOTE    Teresa Gutierrez  YQM:[id number] DOB: 09-03-1965 DOA: 01/25/2021 PCP: Reubin Milan, MD   Brief Narrative: 55 year old with past medical history significant for chronic pain, emphysema, tobacco use, hypertension, anxiety, hyperlipidemia who presents complaining of chest pain.  Patient reports intermittent chest pain for 3 weeks. She report chest pain as substernal,  radiation to the left arm and jaw.  Associated with exertion, relieved by rest. Evaluation in the ED: Patient  presented with significantly elevated hypertension, resolved spontaneously.  Troponin elevated, the sinus rhythm no  ST elevation.   Assessment & Plan:   Principal Problem:   Chest pain, rule out acute myocardial infarction Active Problems:   Hyperlipidemia   Essential hypertension   Generalized anxiety disorder   1-Chest Pain, positive troponin:  Presents with chest pain, multiple risk factors, Troponin elevated: 26--53-30 Cardiology consulted.  ECHO normal EF. No wall motion abnormalities.  No need to start Heparin Gtt per cardiology.  Continue with aspirin, statins.  Patient had episode of chest pain this am. Elevated SBP 200, ST depression lead III. Received nitroglycerin with resolution of chest pain and improvement of BP.  Plan for cath this am.   2-HTN; Continue with Irbesartan.  Monitor.   3-HLD;  Triglyceride 400. Discussed diet with patient. Continue with statins, increase dose to 80 mg.   4-Anxiety: Continue with Celexa.     Estimated body mass index is 25.5 kg/m as calculated from the following:   Height as of this encounter: 5\' 7"  (1.702 m).   Weight as of this encounter: 73.8 kg.   DVT prophylaxis: Lovenox Code Status: Full code Family Communication: Significant other at bedside Disposition Plan:  Status is: Observation  The patient remains OBS appropriate and will d/c before 2 midnights.  Dispo: The patient is from: Home              Anticipated d/c is  to: Home              Patient currently is not medically stable to d/c.   Difficult to place patient No        Consultants:   Cardiology   Procedures:   ECHO  Antimicrobials:    Subjective: During my rounds patient develops chest pain 10/10, SBP was 209/90. She received nitroglycerin which resolved chest pain.    Objective: Vitals:   01/27/21 0843 01/27/21 0850 01/27/21 1114 01/27/21 1245  BP: (!) 209/93 (!) 151/93 111/74 126/81  Pulse: 69 76 71 77  Resp:   18 18  Temp:   98.4 F (36.9 C) 97.9 F (36.6 C)  TempSrc:   Oral Oral  SpO2:   97% 97%  Weight:      Height:        Intake/Output Summary (Last 24 hours) at 01/27/2021 1301 Last data filed at 01/27/2021 1011 Gross per 24 hour  Intake 0 ml  Output --  Net 0 ml   Filed Weights   01/25/21 2018 01/26/21 2024 01/27/21 0745  Weight: 72.6 kg 74.6 kg 73.8 kg    Examination:  General exam: NAD Respiratory system: CTA Cardiovascular system: S 1, S 2 RRR Gastrointestinal system: BS present, soft, nt Central nervous system: A;ert Extremities: no edema Skin:  No rashes.    Data Reviewed: I have personally reviewed following labs and imaging studies  CBC: Recent Labs  Lab 01/25/21 2027  WBC 8.6  NEUTROABS 5.4  HGB 14.4  HCT 41.8  MCV 90.5  PLT 235   Basic Metabolic  Panel: Recent Labs  Lab 01/25/21 0321 01/25/21 2027  NA  --  138  K  --  4.5  CL  --  105  CO2  --  24  GLUCOSE  --  106*  BUN  --  21*  CREATININE 0.55 0.71  CALCIUM  --  9.5   GFR: Estimated Creatinine Clearance: 78.2 mL/min (by C-G formula based on SCr of 0.71 mg/dL). Liver Function Tests: Recent Labs  Lab 01/25/21 2027  AST 35  ALT 29  ALKPHOS 111  BILITOT 1.0  PROT 7.3  ALBUMIN 4.6   Recent Labs  Lab 01/25/21 2027  LIPASE 28   No results for input(s): AMMONIA in the last 168 hours. Coagulation Profile: No results for input(s): INR, PROTIME in the last 168 hours. Cardiac Enzymes: No results for input(s):  CKTOTAL, CKMB, CKMBINDEX, TROPONINI in the last 168 hours. BNP (last 3 results) No results for input(s): PROBNP in the last 8760 hours. HbA1C: Recent Labs    01/26/21 0321  HGBA1C 6.0*   CBG: No results for input(s): GLUCAP in the last 168 hours. Lipid Profile: Recent Labs    01/26/21 0321  CHOL 240*  HDL 39*  LDLCALC UNABLE TO CALCULATE IF TRIGLYCERIDE OVER 400 mg/dL  TRIG 638*  CHOLHDL 6.2  LDLDIRECT 138.2*   Thyroid Function Tests: No results for input(s): TSH, T4TOTAL, FREET4, T3FREE, THYROIDAB in the last 72 hours. Anemia Panel: No results for input(s): VITAMINB12, FOLATE, FERRITIN, TIBC, IRON, RETICCTPCT in the last 72 hours. Sepsis Labs: No results for input(s): PROCALCITON, LATICACIDVEN in the last 168 hours.  Recent Results (from the past 240 hour(s))  Resp Panel by RT-PCR (Flu A&B, Covid) Nasopharyngeal Swab     Status: None   Collection Time: 01/25/21  8:48 PM   Specimen: Nasopharyngeal Swab; Nasopharyngeal(NP) swabs in vial transport medium  Result Value Ref Range Status   SARS Coronavirus 2 by RT PCR NEGATIVE NEGATIVE Final    Comment: (NOTE) SARS-CoV-2 target nucleic acids are NOT DETECTED.  The SARS-CoV-2 RNA is generally detectable in upper respiratory specimens during the acute phase of infection. The lowest concentration of SARS-CoV-2 viral copies this assay can detect is 138 copies/mL. A negative result does not preclude SARS-Cov-2 infection and should not be used as the sole basis for treatment or other patient management decisions. A negative result may occur with  improper specimen collection/handling, submission of specimen other than nasopharyngeal swab, presence of viral mutation(s) within the areas targeted by this assay, and inadequate number of viral copies(<138 copies/mL). A negative result must be combined with clinical observations, patient history, and epidemiological information. The expected result is Negative.  Fact Sheet for  Patients:  BloggerCourse.com  Fact Sheet for Healthcare Providers:  SeriousBroker.it  This test is no t yet approved or cleared by the [place] FDA and  has been authorized for detection and/or diagnosis of SARS-CoV-2 by FDA under an Emergency Use Authorization (EUA). This EUA will remain  in effect (meaning this test can be used) for the duration of the COVID-19 declaration under Section 564(b)(1) of the Act, 21 U.S.C.section 360bbb-3(b)(1), unless the authorization is terminated  or revoked sooner.       Influenza A by PCR NEGATIVE NEGATIVE Final   Influenza B by PCR NEGATIVE NEGATIVE Final    Comment: (NOTE) The Xpert Xpress SARS-CoV-2/FLU/RSV plus assay is intended as an aid in the diagnosis of influenza from Nasopharyngeal swab specimens and should not be used as a sole basis for treatment. Nasal  washings and aspirates are unacceptable for Xpert Xpress SARS-CoV-2/FLU/RSV testing.  Fact Sheet for Patients: BloggerCourse.comhttps://www.fda.gov/media/152166/download  Fact Sheet for Healthcare Providers: SeriousBroker.ithttps://www.fda.gov/media/152162/download  This test is not yet approved or cleared by the [place] FDA and has been authorized for detection and/or diagnosis of SARS-CoV-2 by FDA under an Emergency Use Authorization (EUA). This EUA will remain in effect (meaning this test can be used) for the duration of the COVID-19 declaration under Section 564(b)(1) of the Act, 21 U.S.C. section 360bbb-3(b)(1), unless the authorization is terminated or revoked.  Performed at [facility], [address] [place], [place] [address]          Radiology Studies: DG Chest Portable 1 View  Result Date: 01/25/2021 CLINICAL DATA:  Chest pain EXAM: PORTABLE CHEST 1 VIEW COMPARISON:  12/02/2015 FINDINGS: The heart size and mediastinal contours are within normal limits. Both lungs are clear. The visualized skeletal structures are  unremarkable. IMPRESSION: Clear lungs Electronically Signed   By: Deatra RobinsonKevin  Herman M.D.   On: 01/25/2021 21:45   ECHOCARDIOGRAM COMPLETE  Result Date: 01/26/2021    ECHOCARDIOGRAM REPORT   Patient Name:   Rich BraveERESA Kraeger Date of Exam: 01/26/2021 Medical Rec #:  [id number]         Height:       67.0 in Accession #:    [id number]        Weight:       160.0 lb Date of Birth:  01-05-1966        BSA:          1.839 m Patient Age:    54 years          BP:           143/86 mmHg Patient Gender: F                 HR:           72 bpm. Exam Location:  [facility] Procedure: 2D Echo Indications:     Chest Pain  History:         Patient has no prior history of Echocardiogram examinations.                  COPD; Risk Factors:Hypertension and Current Smoker.  Sonographer:     L Thornton-Maynard Referring Phys:  [id number] Cecille PoALEXANDER B MELVIN Diagnosing Phys: Harold HedgeKenneth Fath MD  Sonographer Comments: Image acquisition challenging due to COPD. IMPRESSIONS  1. Left ventricular ejection fraction, by estimation, is 55 to 60%. The left ventricle has normal function. The left ventricle has no regional wall motion abnormalities. Left ventricular diastolic parameters were normal.  2. Right ventricular systolic function is normal. The right ventricular size is mildly enlarged.  3. The mitral valve was not well visualized. Trivial mitral valve regurgitation.  4. The aortic valve was not well visualized. Aortic valve regurgitation is not visualized. FINDINGS  Left Ventricle: Left ventricular ejection fraction, by estimation, is 55 to 60%. The left ventricle has normal function. The left ventricle has no regional wall motion abnormalities. The left ventricular internal cavity size was normal in size. There is  no left ventricular hypertrophy. Left ventricular diastolic parameters were normal. Right Ventricle: The right ventricular size is mildly enlarged. No increase in right ventricular wall thickness. Right ventricular systolic function is normal. Left  Atrium: Left atrial size was normal in size. Right Atrium: Right atrial size was normal in size. Pericardium: There is no evidence of pericardial effusion. Mitral Valve: The mitral valve was not well  visualized. Trivial mitral valve regurgitation. Tricuspid Valve: The tricuspid valve is not well visualized. Tricuspid valve regurgitation is trivial. Aortic Valve: The aortic valve was not well visualized. Aortic valve regurgitation is not visualized. Aortic valve peak gradient measures 4.4 mmHg. Pulmonic Valve: The pulmonic valve was not assessed. Pulmonic valve regurgitation is not visualized. Aorta: The aortic root is normal in size and structure. IAS/Shunts: The interatrial septum was not assessed.  LEFT VENTRICLE PLAX 2D LVIDd:         3.19 cm  Diastology LVIDs:         2.40 cm  LV e' medial:    6.31 cm/s LV PW:         1.27 cm  LV E/e' medial:  8.7 LV IVS:        1.33 cm  LV e' lateral:   7.62 cm/s LVOT diam:     2.10 cm  LV E/e' lateral: 7.2 LV SV:         58 LV SV Index:   31 LVOT Area:     3.46 cm  RIGHT VENTRICLE RV S prime:     12.10 cm/s LEFT ATRIUM             Index LA diam:        2.60 cm 1.41 cm/m LA Vol (A2C):   29.9 ml 16.26 ml/m LA Vol (A4C):   34.6 ml 18.81 ml/m LA Biplane Vol: 32.8 ml 17.83 ml/m  AORTIC VALVE                PULMONIC VALVE AV Area (Vmax): 2.82 cm    PV Vmax:       0.99 m/s AV Vmax:        105.00 cm/s PV Peak grad:  3.9 mmHg AV Peak Grad:   4.4 mmHg LVOT Vmax:      85.50 cm/s LVOT Vmean:     61.100 cm/s LVOT VTI:       0.167 m  AORTA Ao Root diam: 3.00 cm MITRAL VALVE MV Area (PHT): 3.85 cm    SHUNTS MV Decel Time: 197 msec    Systemic VTI:  0.17 m MV E velocity: 54.80 cm/s  Systemic Diam: 2.10 cm MV A velocity: 66.00 cm/s MV E/A ratio:  0.83 Harold Hedge MD Electronically signed by Harold Hedge MD Signature Date/Time: 01/26/2021/12:08:49 PM    Final         Scheduled Meds: . Melene Muller ON 01/28/2021] aspirin  81 mg Oral Pre-Cath  . [MAR Hold] aspirin  325 mg Oral Daily  .  [MAR Hold] atorvastatin  80 mg Oral Daily  . [MAR Hold] citalopram  10 mg Oral Daily  . [MAR Hold] enoxaparin (LOVENOX) injection  40 mg Subcutaneous Q24H  . [MAR Hold] irbesartan  300 mg Oral Daily  . [MAR Hold] sodium chloride flush  3 mL Intravenous Q12H   Continuous Infusions: . sodium chloride    . [START ON 01/28/2021] sodium chloride 3 mL/kg/hr (01/27/21 1256)   Followed by  . [START ON 01/28/2021] sodium chloride       LOS: 0 days    Time spent: 35 minutes.     Alba Cory, MD [facility]   If 7PM-7AM, please contact night-coverage [email or web address]  01/27/2021, 1:01 PM

## 2021-01-27 NOTE — Progress Notes (Signed)
Patient Name: Teresa Gutierrez Date of Encounter: 01/27/2021  Hospital Problem List     Principal Problem:   Chest pain, rule out acute myocardial infarction Active Problems:   Hyperlipidemia   Essential hypertension   Generalized anxiety disorder    Patient Profile     55 y.o. female with history of palpitations, shortness of breath treated as an outpatient with Avapro.  She has a history of hyperlipidemia on Lipitor.  She is a smoker.  She has a history of chronic pain, emphysema as well.  She presented to the ER after approximately 3 weeks of intermittent chest pain.  These are substernal radiating to her jaw.  It is originated in her right side now both sides.  Described as stabbing pain.  Last several minutes.  It is worse with exertion per her report.  In the emergency room she was hypertensive.  Chest x-ray was unremarkable.  Her troponin was mildly elevated but flat with troponins of 26, 53 and 30.  Her triglycerides are 411 with an HDL of 39.  Renal function is normal.  CBC was normal.  EKG showed sinus rhythm with no ischemic changes.  Patient is currently treated with aspirin, atorvastatin 80 mg daily, irbesartan 300 mg daily.  She states the pain has gradually increased in frequency and severity.  Subjective   Pt had recurrent 10/10 midsternal and left arm chest pain last pm. Currently pain free. Episode lasted 10-15 minutes. EKG and troponin pending.   Inpatient Medications    . aspirin  325 mg Oral Daily  . atorvastatin  80 mg Oral Daily  . citalopram  10 mg Oral Daily  . enoxaparin (LOVENOX) injection  40 mg Subcutaneous Q24H  . irbesartan  300 mg Oral Daily  . sodium chloride flush  3 mL Intravenous Q12H    Vital Signs    Vitals:   01/26/21 2014 01/26/21 2024 01/27/21 0459 01/27/21 0745  BP: 137/81  132/87 (!) 143/94  Pulse: 84  68 79  Resp: 18  16 17   Temp: 98.1 F (36.7 C)  98.4 F (36.9 C) 98.2 F (36.8 C)  TempSrc: Oral  Oral Oral  SpO2: 97%  95% 97%   Weight:  74.6 kg  73.8 kg  Height:       No intake or output data in the 24 hours ending 01/27/21 0830 Filed Weights   01/25/21 2018 01/26/21 2024 01/27/21 0745  Weight: 72.6 kg 74.6 kg 73.8 kg    Physical Exam    GEN: Well nourished, well developed, in no acute distress.  HEENT: normal.  Neck: Supple, no JVD, carotid bruits, or masses. Cardiac: RRR, no murmurs, rubs, or gallops. No clubbing, cyanosis, edema.  Radials/DP/PT 2+ and equal bilaterally.  Respiratory:  Respirations regular and unlabored, clear to auscultation bilaterally. GI: Soft, nontender, nondistended, BS + x 4. MS: no deformity or atrophy. Skin: warm and dry, no rash. Neuro:  Strength and sensation are intact. Psych: Normal affect.  Labs    CBC Recent Labs    01/25/21 2027  WBC 8.6  NEUTROABS 5.4  HGB 14.4  HCT 41.8  MCV 90.5  PLT 235   Basic Metabolic Panel Recent Labs    2028 0321 01/25/21 2027  NA  --  138  K  --  4.5  CL  --  105  CO2  --  24  GLUCOSE  --  106*  BUN  --  21*  CREATININE 0.55 0.71  CALCIUM  --  9.5  Liver Function Tests Recent Labs    01/25/21 2027  AST 35  ALT 29  ALKPHOS 111  BILITOT 1.0  PROT 7.3  ALBUMIN 4.6   Recent Labs    01/25/21 2027  LIPASE 28   Cardiac Enzymes No results for input(s): CKTOTAL, CKMB, CKMBINDEX, TROPONINI in the last 72 hours. BNP No results for input(s): BNP in the last 72 hours. D-Dimer No results for input(s): DDIMER in the last 72 hours. Hemoglobin A1C No results for input(s): HGBA1C in the last 72 hours. Fasting Lipid Panel Recent Labs    01/26/21 0321  CHOL 240*  HDL 39*  LDLCALC UNABLE TO CALCULATE IF TRIGLYCERIDE OVER 400 mg/dL  TRIG 332*  CHOLHDL 6.2  LDLDIRECT 138.2*   Thyroid Function Tests No results for input(s): TSH, T4TOTAL, T3FREE, THYROIDAB in the last 72 hours.  Invalid input(s): FREET3  Telemetry    nsr  ECG    nsr with no ischemia  Radiology    DG Chest Portable 1 View  Result  Date: 01/25/2021 CLINICAL DATA:  Chest pain EXAM: PORTABLE CHEST 1 VIEW COMPARISON:  12/02/2015 FINDINGS: The heart size and mediastinal contours are within normal limits. Both lungs are clear. The visualized skeletal structures are unremarkable. IMPRESSION: Clear lungs Electronically Signed   By: Deatra Robinson M.D.   On: 01/25/2021 21:45   ECHOCARDIOGRAM COMPLETE  Result Date: 01/26/2021    ECHOCARDIOGRAM REPORT   Patient Name:   TIFFINY WORTHY Date of Exam: 01/26/2021 Medical Rec #:  [id number]         Height:       67.0 in Accession #:    [id number]        Weight:       160.0 lb Date of Birth:  10/09/1965        BSA:          1.839 m Patient Age:    54 years          BP:           143/86 mmHg Patient Gender: F                 HR:           72 bpm. Exam Location:  [facility] Procedure: 2D Echo Indications:     Chest Pain  History:         Patient has no prior history of Echocardiogram examinations.                  COPD; Risk Factors:Hypertension and Current Smoker.  Sonographer:     L Thornton-Maynard Referring Phys:  [id number] Cecille Po MELVIN Diagnosing Phys: Harold Hedge MD  Sonographer Comments: Image acquisition challenging due to COPD. IMPRESSIONS  1. Left ventricular ejection fraction, by estimation, is 55 to 60%. The left ventricle has normal function. The left ventricle has no regional wall motion abnormalities. Left ventricular diastolic parameters were normal.  2. Right ventricular systolic function is normal. The right ventricular size is mildly enlarged.  3. The mitral valve was not well visualized. Trivial mitral valve regurgitation.  4. The aortic valve was not well visualized. Aortic valve regurgitation is not visualized. FINDINGS  Left Ventricle: Left ventricular ejection fraction, by estimation, is 55 to 60%. The left ventricle has normal function. The left ventricle has no regional wall motion abnormalities. The left ventricular internal cavity size was normal in size. There is  no left  ventricular hypertrophy. Left ventricular diastolic parameters were normal. Right Ventricle: The  right ventricular size is mildly enlarged. No increase in right ventricular wall thickness. Right ventricular systolic function is normal. Left Atrium: Left atrial size was normal in size. Right Atrium: Right atrial size was normal in size. Pericardium: There is no evidence of pericardial effusion. Mitral Valve: The mitral valve was not well visualized. Trivial mitral valve regurgitation. Tricuspid Valve: The tricuspid valve is not well visualized. Tricuspid valve regurgitation is trivial. Aortic Valve: The aortic valve was not well visualized. Aortic valve regurgitation is not visualized. Aortic valve peak gradient measures 4.4 mmHg. Pulmonic Valve: The pulmonic valve was not assessed. Pulmonic valve regurgitation is not visualized. Aorta: The aortic root is normal in size and structure. IAS/Shunts: The interatrial septum was not assessed.  LEFT VENTRICLE PLAX 2D LVIDd:         3.19 cm  Diastology LVIDs:         2.40 cm  LV e' medial:    6.31 cm/s LV PW:         1.27 cm  LV E/e' medial:  8.7 LV IVS:        1.33 cm  LV e' lateral:   7.62 cm/s LVOT diam:     2.10 cm  LV E/e' lateral: 7.2 LV SV:         58 LV SV Index:   31 LVOT Area:     3.46 cm  RIGHT VENTRICLE RV S prime:     12.10 cm/s LEFT ATRIUM             Index LA diam:        2.60 cm 1.41 cm/m LA Vol (A2C):   29.9 ml 16.26 ml/m LA Vol (A4C):   34.6 ml 18.81 ml/m LA Biplane Vol: 32.8 ml 17.83 ml/m  AORTIC VALVE                PULMONIC VALVE AV Area (Vmax): 2.82 cm    PV Vmax:       0.99 m/s AV Vmax:        105.00 cm/s PV Peak grad:  3.9 mmHg AV Peak Grad:   4.4 mmHg LVOT Vmax:      85.50 cm/s LVOT Vmean:     61.100 cm/s LVOT VTI:       0.167 m  AORTA Ao Root diam: 3.00 cm MITRAL VALVE MV Area (PHT): 3.85 cm    SHUNTS MV Decel Time: 197 msec    Systemic VTI:  0.17 m MV E velocity: 54.80 cm/s  Systemic Diam: 2.10 cm MV A velocity: 66.00 cm/s MV E/A ratio:   0.83 Harold HedgeKenneth Arjay Jaskiewicz MD Electronically signed by Harold HedgeKenneth Caroljean Monsivais MD Signature Date/Time: 01/26/2021/12:08:49 PM    Final     Assessment & Plan    55 year old female with history of palpitations, chronic tobacco abuse, hypertension, hyperlipidemia who presented to the emergency room complaints of chest pain.  She is ruled out for myocardial infarction.  She has mildly elevated serum troponins however they were flat.  EKG showed no ischemia.  She is currently pain-free.  Risk factors include hyperlipidemia hypertension and tobacco abuse.  1.  Chest pain-flat troponins.  Does not appear to suggest non-STEMI.  EKG unremarkable.  We will proceed with an echocardiogram to evaluate LV function to evaluate for wall motion abnormality.  We will also tentatively plan for a Lexiscan sestamibi to evaluate for ischemia. Echo showed normal lv funciton but continues to have resting chest pain. Will cancel functional study and proceed with left heart cath.  2.  Hypertension-continue with irbesartan.  3.  Hyperlipidemia-continue with atorvastatin at 80 mg daily.  4.  Tobacco abuse-patient was counseled.  She states she is attempting to quit.   Signed, Darlin Priestly Fany Cavanaugh MD 01/27/2021, 8:30 AM  Pager: [phone]

## 2021-01-28 ENCOUNTER — Encounter: Payer: Self-pay | Admitting: Cardiology

## 2021-01-28 DIAGNOSIS — Z8249 Family history of ischemic heart disease and other diseases of the circulatory system: Secondary | ICD-10-CM | POA: Diagnosis not present

## 2021-01-28 DIAGNOSIS — R079 Chest pain, unspecified: Secondary | ICD-10-CM | POA: Diagnosis present

## 2021-01-28 DIAGNOSIS — Z833 Family history of diabetes mellitus: Secondary | ICD-10-CM | POA: Diagnosis not present

## 2021-01-28 DIAGNOSIS — F411 Generalized anxiety disorder: Secondary | ICD-10-CM | POA: Diagnosis present

## 2021-01-28 DIAGNOSIS — E785 Hyperlipidemia, unspecified: Secondary | ICD-10-CM | POA: Diagnosis present

## 2021-01-28 DIAGNOSIS — F17210 Nicotine dependence, cigarettes, uncomplicated: Secondary | ICD-10-CM | POA: Diagnosis present

## 2021-01-28 DIAGNOSIS — Z79899 Other long term (current) drug therapy: Secondary | ICD-10-CM | POA: Diagnosis not present

## 2021-01-28 DIAGNOSIS — I25110 Atherosclerotic heart disease of native coronary artery with unstable angina pectoris: Secondary | ICD-10-CM | POA: Diagnosis present

## 2021-01-28 DIAGNOSIS — Z20822 Contact with and (suspected) exposure to covid-19: Secondary | ICD-10-CM | POA: Diagnosis present

## 2021-01-28 DIAGNOSIS — K219 Gastro-esophageal reflux disease without esophagitis: Secondary | ICD-10-CM | POA: Diagnosis present

## 2021-01-28 DIAGNOSIS — J439 Emphysema, unspecified: Secondary | ICD-10-CM | POA: Diagnosis present

## 2021-01-28 DIAGNOSIS — I10 Essential (primary) hypertension: Secondary | ICD-10-CM | POA: Diagnosis present

## 2021-01-28 DIAGNOSIS — I208 Other forms of angina pectoris: Secondary | ICD-10-CM | POA: Diagnosis present

## 2021-01-28 LAB — BASIC METABOLIC PANEL
Anion gap: 9 (ref 5–15)
BUN: 13 mg/dL (ref 6–20)
CO2: 23 mmol/L (ref 22–32)
Calcium: 9.3 mg/dL (ref 8.9–10.3)
Chloride: 107 mmol/L (ref 98–111)
Creatinine, Ser: 0.65 mg/dL (ref 0.44–1.00)
GFR, Estimated: >60 mL/min (ref >60)
Glucose, Bld: 107 mg/dL — ABNORMAL HIGH (ref 70–99)
Potassium: 4.2 mmol/L (ref 3.5–5.1)
Sodium: 139 mmol/L (ref 135–145)

## 2021-01-28 LAB — CBC
HCT: 41.5 % (ref 36.0–46.0)
Hemoglobin: 13.8 g/dL (ref 12.0–15.0)
MCH: 30.7 pg (ref 26.0–34.0)
MCHC: 33.3 g/dL (ref 30.0–36.0)
MCV: 92.4 fL (ref 80.0–100.0)
Platelets: 245 10*3/uL (ref 150–400)
RBC: 4.49 MIL/uL (ref 3.87–5.11)
RDW: 12.7 % (ref 11.5–15.5)
WBC: 10.5 10*3/uL (ref 4.0–10.5)
nRBC: 0 % (ref 0.0–0.2)

## 2021-01-28 MED ORDER — ASPIRIN 81 MG PO CHEW: 81.0000 mg | CHEWABLE_TABLET | Freq: Every day | ORAL | 6 refills | Status: DC

## 2021-01-28 MED ORDER — NITROGLYCERIN 0.4 MG SL SUBL: 0.4000 mg | SUBLINGUAL_TABLET | SUBLINGUAL | 1 refills | Status: AC | PRN

## 2021-01-28 MED ORDER — ATORVASTATIN CALCIUM 80 MG PO TABS: 80.0000 mg | ORAL_TABLET | Freq: Every day | ORAL | 1 refills | Status: DC

## 2021-01-28 MED ORDER — METOPROLOL TARTRATE 25 MG PO TABS: 25.0000 mg | ORAL_TABLET | Freq: Two times a day (BID) | ORAL | 5 refills | Status: DC

## 2021-01-28 MED ORDER — IRBESARTAN 300 MG PO TABS: 150.0000 mg | ORAL_TABLET | Freq: Every day | ORAL | 1 refills | Status: DC

## 2021-01-28 MED ORDER — IRBESARTAN 150 MG PO TABS
150.0000 mg | ORAL_TABLET | Freq: Every day | ORAL | Status: DC
  Administered 2021-01-28: 150 mg via ORAL
  Filled 2021-01-28: qty 1

## 2021-01-28 MED ORDER — TICAGRELOR 90 MG PO TABS: 90.0000 mg | ORAL_TABLET | Freq: Two times a day (BID) | ORAL | 6 refills | Status: DC

## 2021-01-28 NOTE — Progress Notes (Signed)
Patient Name: Elmer Boutelle Date of Encounter: 01/28/2021  Hospital Problem List     Principal Problem:   Chest pain, rule out acute myocardial infarction Active Problems:   Hyperlipidemia   Essential hypertension   Generalized anxiety disorder    Patient Profile      55 y.o.femalewith history ofpalpitations, shortness of breath treated as an outpatient with Avapro. She has a history of hyperlipidemia on Lipitor. She is a smoker. She has a history of chronic pain, emphysema as well. She presented to the ER after approximately 3 weeks of intermittent chest pain. These are substernal radiating to her jaw. It is originated in her right side now both sides. Described as stabbing pain. Last several minutes. It is worse with exertion per her report. In the emergency room she was hypertensive. Chest x-ray was unremarkable. Her troponin was mildly elevated but flat with troponins of 26, 53 and 30. Her triglycerides are 411 with an HDL of 39. Renal function is normal. CBC was normal. EKG showed sinus rhythm with no ischemic changes. Patient is currently treated with aspirin, atorvastatin 80 mg daily, irbesartan 300 mg daily.   Subjective   No chest paiin  Inpatient Medications    . aspirin  81 mg Oral Daily  . atorvastatin  80 mg Oral Daily  . citalopram  10 mg Oral Daily  . enoxaparin (LOVENOX) injection  40 mg Subcutaneous Q24H  . irbesartan  300 mg Oral Daily  . metoprolol tartrate  25 mg Oral BID  . sodium chloride flush  3 mL Intravenous Q12H  . sodium chloride flush  3 mL Intravenous Q12H  . ticagrelor  90 mg Oral BID    Vital Signs    Vitals:   01/27/21 1700 01/27/21 1802 01/27/21 1957 01/28/21 0440  BP: (!) 144/96 (!) 154/84 132/81 (!) 119/94  Pulse: 79 75  60  Resp: 13 18 20 17   Temp:   98.4 F (36.9 C) 97.7 F (36.5 C)  TempSrc:   Oral Oral  SpO2: 97% 96% 97% 98%  Weight:      Height:        Intake/Output Summary (Last 24 hours) at  01/28/2021 0728 Last data filed at 01/28/2021 0455 Gross per 24 hour  Intake 779.09 ml  Output --  Net 779.09 ml   Filed Weights   01/25/21 2018 01/26/21 2024 01/27/21 0745  Weight: 72.6 kg 74.6 kg 73.8 kg    Physical Exam    GEN: Well nourished, well developed, in no acute distress.  HEENT: normal.  Neck: Supple, no JVD, carotid bruits, or masses. Cardiac: RRR, no murmurs, rubs, or gallops. No clubbing, cyanosis, edema.  Radials/DP/PT 2+ and equal bilaterally.  Respiratory:  Respirations regular and unlabored, clear to auscultation bilaterally. GI: Soft, nontender, nondistended, BS + x 4. MS: no deformity or atrophy. Skin: warm and dry, no rash. Neuro:  Strength and sensation are intact. Psych: Normal affect.  Labs    CBC Recent Labs    01/25/21 2027 01/28/21 0553  WBC 8.6 10.5  NEUTROABS 5.4  --   HGB 14.4 13.8  HCT 41.8 41.5  MCV 90.5 92.4  PLT 235 245   Basic Metabolic Panel Recent Labs    03/30/21 2027 01/28/21 0649  NA 138 139  K 4.5 4.2  CL 105 107  CO2 24 23  GLUCOSE 106* 107*  BUN 21* 13  CREATININE 0.71 0.65  CALCIUM 9.5 9.3   Liver Function Tests Recent Labs    01/25/21  2027  AST 35  ALT 29  ALKPHOS 111  BILITOT 1.0  PROT 7.3  ALBUMIN 4.6   Recent Labs    01/25/21 2027  LIPASE 28   Cardiac Enzymes No results for input(s): CKTOTAL, CKMB, CKMBINDEX, TROPONINI in the last 72 hours. BNP No results for input(s): BNP in the last 72 hours. D-Dimer No results for input(s): DDIMER in the last 72 hours. Hemoglobin A1C Recent Labs    01/26/21 0321  HGBA1C 6.0*   Fasting Lipid Panel Recent Labs    01/26/21 0321  CHOL 240*  HDL 39*  LDLCALC UNABLE TO CALCULATE IF TRIGLYCERIDE OVER 400 mg/dL  TRIG [id number]*  CHOLHDL 6.2  LDLDIRECT 138.2*   Thyroid Function Tests No results for input(s): TSH, T4TOTAL, T3FREE, THYROIDAB in the last 72 hours.  Invalid input(s): FREET3  Telemetry    nsr  ECG    nsr with no ischemia  Radiology     CARDIAC CATHETERIZATION  Result Date: 01/27/2021  Prox RCA lesion is 20% stenosed.  Mid RCA lesion is 50% stenosed.  2nd Mrg lesion is 30% stenosed.  1st Mrg-1 lesion is 90% stenosed.  1st Mrg-2 lesion is 95% stenosed.  A drug-eluting stent was successfully placed using a STENT RESOLUTE ONYX 2.25X15.  Post intervention, there is a 0% residual stenosis.  A drug-eluting stent was successfully placed using a STENT RESOLUTE ONYX 2.5X15.  Post intervention, there is a 0% residual stenosis.  1.  One-vessel coronary artery disease with 90% proximal and 95% mid large caliber obtuse marginal branch 2.  Normal left ventricular function 3.  Successful PCI with overlapping 2.5 x 15 mm, 2.25 x 12 mm, and 2.25 x 15 mm Resolute Onyx stents proximal and mid OM1 Recommendations 1.  Dual antiplatelet therapy uninterrupted for 1 year 2.  Aggressive risk factor modification   DG Chest Portable 1 View  Result Date: 01/25/2021 CLINICAL DATA:  Chest pain EXAM: PORTABLE CHEST 1 VIEW COMPARISON:  12/02/2015 FINDINGS: The heart size and mediastinal contours are within normal limits. Both lungs are clear. The visualized skeletal structures are unremarkable. IMPRESSION: Clear lungs Electronically Signed   By: Deatra RobinsonKevin  Herman M.D.   On: 01/25/2021 21:45   ECHOCARDIOGRAM COMPLETE  Result Date: 01/26/2021    ECHOCARDIOGRAM REPORT   Patient Name:   Rich BraveERESA Barclift Date of Exam: 01/26/2021 Medical Rec #:  [id number]         Height:       67.0 in Accession #:    [id number]        Weight:       160.0 lb Date of Birth:  [birth year]        BSA:          1.839 m Patient Age:    54 years          BP:           143/86 mmHg Patient Gender: F                 HR:           72 bpm. Exam Location:  [facility] Procedure: 2D Echo Indications:     Chest Pain  History:         Patient has no prior history of Echocardiogram examinations.                  COPD; Risk Factors:Hypertension and Current Smoker.  Sonographer:     L Thornton-Maynard Referring  Phys:  [id number] Cecille PoALEXANDER B MELVIN  Diagnosing Phys: Harold Hedge MD  Sonographer Comments: Image acquisition challenging due to COPD. IMPRESSIONS  1. Left ventricular ejection fraction, by estimation, is 55 to 60%. The left ventricle has normal function. The left ventricle has no regional wall motion abnormalities. Left ventricular diastolic parameters were normal.  2. Right ventricular systolic function is normal. The right ventricular size is mildly enlarged.  3. The mitral valve was not well visualized. Trivial mitral valve regurgitation.  4. The aortic valve was not well visualized. Aortic valve regurgitation is not visualized. FINDINGS  Left Ventricle: Left ventricular ejection fraction, by estimation, is 55 to 60%. The left ventricle has normal function. The left ventricle has no regional wall motion abnormalities. The left ventricular internal cavity size was normal in size. There is  no left ventricular hypertrophy. Left ventricular diastolic parameters were normal. Right Ventricle: The right ventricular size is mildly enlarged. No increase in right ventricular wall thickness. Right ventricular systolic function is normal. Left Atrium: Left atrial size was normal in size. Right Atrium: Right atrial size was normal in size. Pericardium: There is no evidence of pericardial effusion. Mitral Valve: The mitral valve was not well visualized. Trivial mitral valve regurgitation. Tricuspid Valve: The tricuspid valve is not well visualized. Tricuspid valve regurgitation is trivial. Aortic Valve: The aortic valve was not well visualized. Aortic valve regurgitation is not visualized. Aortic valve peak gradient measures 4.4 mmHg. Pulmonic Valve: The pulmonic valve was not assessed. Pulmonic valve regurgitation is not visualized. Aorta: The aortic root is normal in size and structure. IAS/Shunts: The interatrial septum was not assessed.  LEFT VENTRICLE PLAX 2D LVIDd:         3.19 cm  Diastology LVIDs:         2.40 cm  LV  e' medial:    6.31 cm/s LV PW:         1.27 cm  LV E/e' medial:  8.7 LV IVS:        1.33 cm  LV e' lateral:   7.62 cm/s LVOT diam:     2.10 cm  LV E/e' lateral: 7.2 LV SV:         58 LV SV Index:   31 LVOT Area:     3.46 cm  RIGHT VENTRICLE RV S prime:     12.10 cm/s LEFT ATRIUM             Index LA diam:        2.60 cm 1.41 cm/m LA Vol (A2C):   29.9 ml 16.26 ml/m LA Vol (A4C):   34.6 ml 18.81 ml/m LA Biplane Vol: 32.8 ml 17.83 ml/m  AORTIC VALVE                PULMONIC VALVE AV Area (Vmax): 2.82 cm    PV Vmax:       0.99 m/s AV Vmax:        105.00 cm/s PV Peak grad:  3.9 mmHg AV Peak Grad:   4.4 mmHg LVOT Vmax:      85.50 cm/s LVOT Vmean:     61.100 cm/s LVOT VTI:       0.167 m  AORTA Ao Root diam: 3.00 cm MITRAL VALVE MV Area (PHT): 3.85 cm    SHUNTS MV Decel Time: 197 msec    Systemic VTI:  0.17 m MV E velocity: 54.80 cm/s  Systemic Diam: 2.10 cm MV A velocity: 66.00 cm/s MV E/A ratio:  0.83 Harold Hedge MD Electronically signed by Harold Hedge MD Signature  Date/Time: 01/26/2021/12:08:49 PM    Final     Assessment & Plan    55 year old female with history of palpitations, chronic tobacco abuse, hypertension, hyperlipidemia who presented to the emergency room complaints of chest pain. She is ruled out for myocardial infarction. She has mildly elevated serum troponins however they were flat. EKG showed no ischemia.  Risk factors include hyperlipidemia, hypertension and tobacco abuse.  1. Chest pain-flat troponins. Does not appear to suggest non-STEMI. EKG unremarkable. Left cath showed sequential lesions in the circumflex OM. S/P pci times 3 . Remainder of anatomy unremarkable. Doing well post pci. OK for discharge on asa, brilinta, atorvastatin and metoprolol 25 daily.    2. Hypertension-continue with irbesartan.  3. Hyperlipidemia-continue with atorvastatin at 80 mg daily.  4. Tobacco abuse-patient was counseled. She states she is attempting to quit.  Signed, Darlin Priestly Alfretta Pinch  MD 01/28/2021, 7:28 AM  Pager: [phone]

## 2021-01-28 NOTE — Discharge Summary (Signed)
Physician Discharge Summary  Teresa Gutierrez IDP:[id number] DOB: 1966/04/30 DOA: 01/25/2021  PCP: Reubin Milan, MD  Admit date: 01/25/2021 Discharge date: 01/28/2021  Admitted From: Home  Disposition:  Home   Recommendations for Outpatient Follow-up:  1. Follow up with PCP in 1-2 weeks 2. Please obtain BMP/CBC in one week 3. Needs follow up with PCP to further discussed management of Pre-DM.  4. Needs to follow up with cardiologist for further care post Stent. 5. Needs repeat Lipid panel, consider lopid if triglycerides still elevated.    Discharge Condition: Stable.  CODE STATUS: Full Code Diet recommendation: Heart Healthy   Brief/Interim Summary: 55 year old with past medical history significant for chronic pain, emphysema, tobacco use, hypertension, anxiety, hyperlipidemia who presents complaining of chest pain.  Patient reports intermittent chest pain for 3 weeks. She report chest pain as substernal,  radiation to the left arm and jaw.  Associated with exertion, relieved by rest. Evaluation in the ED: Patient  presented with significantly elevated hypertension, resolved spontaneously. Troponin elevated, the sinus rhythm no  ST elevation.   1-Unstable Angina.  Presents with chest pain, multiple risk factors, Troponin elevated: 26--53-30 Cardiology consulted.  ECHO normal EF. No wall motion abnormalities.  No need to start Heparin Gtt per cardiology.  Continue with aspirin, statins.  Patient had episode of chest pain on 6-06. Elevated SBP 200, ST depression lead III. Received nitroglycerin with resolution of chest pain and improvement of BP.  Patient Underwent Heart Cath showed; One vessel coronary artery disease with 90% proximal and 95% mid large caliber obtuse marginal branch.  Successful PCI with overlapping 2.5 x 15 mm, 2.25 x 12 mm and 2.25 x 15 mm resolute Onyx stent proximal and mid OM 1. Patient was a started on beta-blocker, metoprolol.  She was also started on  Brilinta.  She will need to be on Brilinta and aspirin for 1 year.  Subsequently she will need to be on aspirin. Will provide nitroglycerin tablet. She needs aggressive risk factors modification. Patient is chest pain-free, she was cleared by cardiology for discharge.  2-HTN; Continue with Irbesartan, decreased dose because patient was a started on metoprolol Monitor.   3-HLD;  Triglyceride 400. Discussed diet with patient. Continue with statins, increase dose to 80 mg.  She will need repeated lipid panel to follow-up triglyceride levels.  She might benefit from Lopid.  4-Anxiety: Continue with Celexa.      Discharge Diagnoses:  Principal Problem:   Chest pain, rule out acute myocardial infarction Active Problems:   Hyperlipidemia   Essential hypertension   Generalized anxiety disorder   Angina at rest [facility])    Discharge Instructions  Discharge Instructions    AMB Referral to Cardiac Rehabilitation - Phase II   Complete by: As directed    Diagnosis: Coronary Stents   After initial evaluation and assessments completed: Virtual Based Care may be provided alone or in conjunction with Phase 2 Cardiac Rehab based on patient barriers.: Yes   Diet - low sodium heart healthy   Complete by: As directed    Increase activity slowly   Complete by: As directed      Allergies as of 01/28/2021      Reactions   Other    Bear meat   Wellbutrin [bupropion]    hostility      Medication List    TAKE these medications   aspirin 81 MG chewable tablet Chew 1 tablet (81 mg total) by mouth daily.   atorvastatin 80 MG tablet Commonly  known as: LIPITOR Take 1 tablet (80 mg total) by mouth daily. What changed:   medication strength  how much to take   citalopram 10 MG tablet Commonly known as: CELEXA Take 1 tablet (10 mg total) by mouth daily.   irbesartan 300 MG tablet Commonly known as: AVAPRO Take 0.5 tablets (150 mg total) by mouth daily. What changed: how much to  take   metoprolol tartrate 25 MG tablet Commonly known as: LOPRESSOR Take 1 tablet (25 mg total) by mouth 2 (two) times daily.   nitroGLYCERIN 0.4 MG SL tablet Commonly known as: NITROSTAT Place 1 tablet (0.4 mg total) under the tongue every 5 (five) minutes as needed for chest pain.   ticagrelor 90 MG Tabs tablet Commonly known as: BRILINTA Take 1 tablet (90 mg total) by mouth 2 (two) times daily.   vitamin B-12 500 MCG tablet Commonly known as: CYANOCOBALAMIN Take 500 mcg by mouth daily.       Follow-up Information    Reubin Milan, MD Follow up in 1 week(s).   Specialty: Internal Medicine Contact information: [address] [place] [address] [phone]        Dalia Heading, MD Follow up.   Specialty: Cardiology Contact information: [address] [place] [address] [phone]              Allergies  Allergen Reactions  . Other     Bear meat  . Wellbutrin [Bupropion]     hostility    Consultations:  Cardiology   Procedures/Studies: CARDIAC CATHETERIZATION  Result Date: 01/27/2021  Prox RCA lesion is 20% stenosed.  Mid RCA lesion is 50% stenosed.  2nd Mrg lesion is 30% stenosed.  1st Mrg-1 lesion is 90% stenosed.  1st Mrg-2 lesion is 95% stenosed.  A drug-eluting stent was successfully placed using a STENT RESOLUTE ONYX 2.25X15.  Post intervention, there is a 0% residual stenosis.  A drug-eluting stent was successfully placed using a STENT RESOLUTE ONYX 2.5X15.  Post intervention, there is a 0% residual stenosis.  1.  One-vessel coronary artery disease with 90% proximal and 95% mid large caliber obtuse marginal branch 2.  Normal left ventricular function 3.  Successful PCI with overlapping 2.5 x 15 mm, 2.25 x 12 mm, and 2.25 x 15 mm Resolute Onyx stents proximal and mid OM1 Recommendations 1.  Dual antiplatelet therapy uninterrupted for 1 year 2.  Aggressive risk factor modification   DG Chest Portable 1  View  Result Date: 01/25/2021 CLINICAL DATA:  Chest pain EXAM: PORTABLE CHEST 1 VIEW COMPARISON:  12/02/2015 FINDINGS: The heart size and mediastinal contours are within normal limits. Both lungs are clear. The visualized skeletal structures are unremarkable. IMPRESSION: Clear lungs Electronically Signed   By: Deatra Robinson M.D.   On: 01/25/2021 21:45   ECHOCARDIOGRAM COMPLETE  Result Date: 01/26/2021    ECHOCARDIOGRAM REPORT   Patient Name:   Teresa Gutierrez Date of Exam: 01/26/2021 Medical Rec #:  [id number]         Height:       67.0 in Accession #:    [id number]        Weight:       160.0 lb Date of Birth:  1965/10/27        BSA:          1.839 m Patient Age:    54 years          BP:  143/86 mmHg Patient Gender: F                 HR:           72 bpm. Exam Location:  [facility] Procedure: 2D Echo Indications:     Chest Pain  History:         Patient has no prior history of Echocardiogram examinations.                  COPD; Risk Factors:Hypertension and Current Smoker.  Sonographer:     L Thornton-Maynard Referring Phys:  [id number] Cecille Po MELVIN Diagnosing Phys: Harold Hedge MD  Sonographer Comments: Image acquisition challenging due to COPD. IMPRESSIONS  1. Left ventricular ejection fraction, by estimation, is 55 to 60%. The left ventricle has normal function. The left ventricle has no regional wall motion abnormalities. Left ventricular diastolic parameters were normal.  2. Right ventricular systolic function is normal. The right ventricular size is mildly enlarged.  3. The mitral valve was not well visualized. Trivial mitral valve regurgitation.  4. The aortic valve was not well visualized. Aortic valve regurgitation is not visualized. FINDINGS  Left Ventricle: Left ventricular ejection fraction, by estimation, is 55 to 60%. The left ventricle has normal function. The left ventricle has no regional wall motion abnormalities. The left ventricular internal cavity size was normal in size. There is  no  left ventricular hypertrophy. Left ventricular diastolic parameters were normal. Right Ventricle: The right ventricular size is mildly enlarged. No increase in right ventricular wall thickness. Right ventricular systolic function is normal. Left Atrium: Left atrial size was normal in size. Right Atrium: Right atrial size was normal in size. Pericardium: There is no evidence of pericardial effusion. Mitral Valve: The mitral valve was not well visualized. Trivial mitral valve regurgitation. Tricuspid Valve: The tricuspid valve is not well visualized. Tricuspid valve regurgitation is trivial. Aortic Valve: The aortic valve was not well visualized. Aortic valve regurgitation is not visualized. Aortic valve peak gradient measures 4.4 mmHg. Pulmonic Valve: The pulmonic valve was not assessed. Pulmonic valve regurgitation is not visualized. Aorta: The aortic root is normal in size and structure. IAS/Shunts: The interatrial septum was not assessed.  LEFT VENTRICLE PLAX 2D LVIDd:         3.19 cm  Diastology LVIDs:         2.40 cm  LV e' medial:    6.31 cm/s LV PW:         1.27 cm  LV E/e' medial:  8.7 LV IVS:        1.33 cm  LV e' lateral:   7.62 cm/s LVOT diam:     2.10 cm  LV E/e' lateral: 7.2 LV SV:         58 LV SV Index:   31 LVOT Area:     3.46 cm  RIGHT VENTRICLE RV S prime:     12.10 cm/s LEFT ATRIUM             Index LA diam:        2.60 cm 1.41 cm/m LA Vol (A2C):   29.9 ml 16.26 ml/m LA Vol (A4C):   34.6 ml 18.81 ml/m LA Biplane Vol: 32.8 ml 17.83 ml/m  AORTIC VALVE                PULMONIC VALVE AV Area (Vmax): 2.82 cm    PV Vmax:       0.99 m/s AV Vmax:  105.00 cm/s PV Peak grad:  3.9 mmHg AV Peak Grad:   4.4 mmHg LVOT Vmax:      85.50 cm/s LVOT Vmean:     61.100 cm/s LVOT VTI:       0.167 m  AORTA Ao Root diam: 3.00 cm MITRAL VALVE MV Area (PHT): 3.85 cm    SHUNTS MV Decel Time: 197 msec    Systemic VTI:  0.17 m MV E velocity: 54.80 cm/s  Systemic Diam: 2.10 cm MV A velocity: 66.00 cm/s MV E/A ratio:   0.83 Harold Hedge MD Electronically signed by Harold Hedge MD Signature Date/Time: 01/26/2021/12:08:49 PM    Final       Subjective: She is feeling better this morning, denies chest pain shortness of breath.  We discussed diet  Discharge Exam: Vitals:   01/27/21 1957 01/28/21 0440  BP: 132/81 (!) 119/94  Pulse:  60  Resp: 20 17  Temp: 98.4 F (36.9 C) 97.7 F (36.5 C)  SpO2: 97% 98%     General: Pt is alert, awake, not in acute distress Cardiovascular: RRR, S1/S2 +, no rubs, no gallops Respiratory: CTA bilaterally, no wheezing, no rhonchi Abdominal: Soft, NT, ND, bowel sounds + Extremities: no edema, no cyanosis    The results of significant diagnostics from this hospitalization (including imaging, microbiology, ancillary and laboratory) are listed below for reference.     Microbiology: Recent Results (from the past 240 hour(s))  Resp Panel by RT-PCR (Flu A&B, Covid) Nasopharyngeal Swab     Status: None   Collection Time: 01/25/21  8:48 PM   Specimen: Nasopharyngeal Swab; Nasopharyngeal(NP) swabs in vial transport medium  Result Value Ref Range Status   SARS Coronavirus 2 by RT PCR NEGATIVE NEGATIVE Final    Comment: (NOTE) SARS-CoV-2 target nucleic acids are NOT DETECTED.  The SARS-CoV-2 RNA is generally detectable in upper respiratory specimens during the acute phase of infection. The lowest concentration of SARS-CoV-2 viral copies this assay can detect is 138 copies/mL. A negative result does not preclude SARS-Cov-2 infection and should not be used as the sole basis for treatment or other patient management decisions. A negative result may occur with  improper specimen collection/handling, submission of specimen other than nasopharyngeal swab, presence of viral mutation(s) within the areas targeted by this assay, and inadequate number of viral copies(<138 copies/mL). A negative result must be combined with clinical observations, patient history, and  epidemiological information. The expected result is Negative.  Fact Sheet for Patients:  BloggerCourse.com  Fact Sheet for Healthcare Providers:  SeriousBroker.it  This test is no t yet approved or cleared by the [place] FDA and  has been authorized for detection and/or diagnosis of SARS-CoV-2 by FDA under an Emergency Use Authorization (EUA). This EUA will remain  in effect (meaning this test can be used) for the duration of the COVID-19 declaration under Section 564(b)(1) of the Act, 21 U.S.C.section 360bbb-3(b)(1), unless the authorization is terminated  or revoked sooner.       Influenza A by PCR NEGATIVE NEGATIVE Final   Influenza B by PCR NEGATIVE NEGATIVE Final    Comment: (NOTE) The Xpert Xpress SARS-CoV-2/FLU/RSV plus assay is intended as an aid in the diagnosis of influenza from Nasopharyngeal swab specimens and should not be used as a sole basis for treatment. Nasal washings and aspirates are unacceptable for Xpert Xpress SARS-CoV-2/FLU/RSV testing.  Fact Sheet for Patients: BloggerCourse.com  Fact Sheet for Healthcare Providers: SeriousBroker.it  This test is not yet approved or cleared by the [place] FDA and has been authorized for detection and/or diagnosis of SARS-CoV-2 by FDA under an Emergency Use Authorization (EUA). This EUA will remain in effect (meaning this test can be used) for the duration of the COVID-19 declaration under Section 564(b)(1) of the Act, 21 U.S.C. section 360bbb-3(b)(1), unless the authorization is terminated or revoked.  Performed at [facility], [address] [place], [place] [address]      Labs: BNP (last 3 results) No results for input(s): BNP in the last 8760 hours. Basic Metabolic Panel: Recent Labs  Lab 01/25/21 0321 01/25/21 2027 01/28/21 0649  NA  --  138 139  K  --  4.5 4.2  CL  --  105 107   CO2  --  24 23  GLUCOSE  --  106* 107*  BUN  --  21* 13  CREATININE 0.55 0.71 0.65  CALCIUM  --  9.5 9.3   Liver Function Tests: Recent Labs  Lab 01/25/21 2027  AST 35  ALT 29  ALKPHOS 111  BILITOT 1.0  PROT 7.3  ALBUMIN 4.6   Recent Labs  Lab 01/25/21 2027  LIPASE 28   No results for input(s): AMMONIA in the last 168 hours. CBC: Recent Labs  Lab 01/25/21 2027 01/28/21 0553  WBC 8.6 10.5  NEUTROABS 5.4  --   HGB 14.4 13.8  HCT 41.8 41.5  MCV 90.5 92.4  PLT 235 245   Cardiac Enzymes: No results for input(s): CKTOTAL, CKMB, CKMBINDEX, TROPONINI in the last 168 hours. BNP: Invalid input(s): POCBNP CBG: No results for input(s): GLUCAP in the last 168 hours. D-Dimer No results for input(s): DDIMER in the last 72 hours. Hgb A1c Recent Labs    01/26/21 0321  HGBA1C 6.0*   Lipid Profile Recent Labs    01/26/21 0321  CHOL 240*  HDL 39*  LDLCALC UNABLE TO CALCULATE IF TRIGLYCERIDE OVER 400 mg/dL  TRIG 914411*  CHOLHDL 6.2  LDLDIRECT 138.2*   Thyroid function studies No results for input(s): TSH, T4TOTAL, T3FREE, THYROIDAB in the last 72 hours.  Invalid input(s): FREET3 Anemia work up No results for input(s): VITAMINB12, FOLATE, FERRITIN, TIBC, IRON, RETICCTPCT in the last 72 hours. Urinalysis    Component Value Date/Time   BILIRUBINUR neg 09/07/2019 0914   PROTEINUR Negative 09/07/2019 0914   UROBILINOGEN 0.2 09/07/2019 0914   NITRITE neg 09/07/2019 0914   LEUKOCYTESUR Negative 09/07/2019 0914   Sepsis Labs Invalid input(s): PROCALCITONIN,  WBC,  LACTICIDVEN Microbiology Recent Results (from the past 240 hour(s))  Resp Panel by RT-PCR (Flu A&B, Covid) Nasopharyngeal Swab     Status: None   Collection Time: 01/25/21  8:48 PM   Specimen: Nasopharyngeal Swab; Nasopharyngeal(NP) swabs in vial transport medium  Result Value Ref Range Status   SARS Coronavirus 2 by RT PCR NEGATIVE NEGATIVE Final    Comment: (NOTE) SARS-CoV-2 target nucleic acids are  NOT DETECTED.  The SARS-CoV-2 RNA is generally detectable in upper respiratory specimens during the acute phase of infection. The lowest concentration of SARS-CoV-2 viral copies this assay can detect is 138 copies/mL. A negative result does not preclude SARS-Cov-2 infection and should not be used as the sole basis for treatment or other patient management decisions. A negative result may occur with  improper specimen collection/handling, submission of specimen other than nasopharyngeal swab, presence of viral mutation(s) within the areas targeted by this assay, and inadequate number of viral copies(<138 copies/mL). A negative result must be combined with clinical observations, patient history, and epidemiological information.  The expected result is Negative.  Fact Sheet for Patients:  BloggerCourse.com  Fact Sheet for Healthcare Providers:  SeriousBroker.it  This test is no t yet approved or cleared by the [place] FDA and  has been authorized for detection and/or diagnosis of SARS-CoV-2 by FDA under an Emergency Use Authorization (EUA). This EUA will remain  in effect (meaning this test can be used) for the duration of the COVID-19 declaration under Section 564(b)(1) of the Act, 21 U.S.C.section 360bbb-3(b)(1), unless the authorization is terminated  or revoked sooner.       Influenza A by PCR NEGATIVE NEGATIVE Final   Influenza B by PCR NEGATIVE NEGATIVE Final    Comment: (NOTE) The Xpert Xpress SARS-CoV-2/FLU/RSV plus assay is intended as an aid in the diagnosis of influenza from Nasopharyngeal swab specimens and should not be used as a sole basis for treatment. Nasal washings and aspirates are unacceptable for Xpert Xpress SARS-CoV-2/FLU/RSV testing.  Fact Sheet for Patients: BloggerCourse.com  Fact Sheet for Healthcare Providers: SeriousBroker.it  This test is not  yet approved or cleared by the [place] FDA and has been authorized for detection and/or diagnosis of SARS-CoV-2 by FDA under an Emergency Use Authorization (EUA). This EUA will remain in effect (meaning this test can be used) for the duration of the COVID-19 declaration under Section 564(b)(1) of the Act, 21 U.S.C. section 360bbb-3(b)(1), unless the authorization is terminated or revoked.  Performed at [facility], [address] [place], [place] [address]      Time coordinating discharge: 40 minutes  SIGNED:   Alba Cory, MD  [facility]

## 2021-01-28 NOTE — Plan of Care (Signed)
  Problem: Health Behavior/Discharge Planning: Goal: Ability to manage health-related needs will improve Outcome: Progressing   Problem: Education: Goal: Ability to verbalize understanding of medication therapies will improve Outcome: Progressing

## 2021-02-07 ENCOUNTER — Ambulatory Visit: Payer: BC Managed Care – PPO | Admitting: Internal Medicine

## 2021-02-10 ENCOUNTER — Ambulatory Visit: Payer: BC Managed Care – PPO | Admitting: Internal Medicine

## 2021-02-10 ENCOUNTER — Other Ambulatory Visit: Payer: Self-pay

## 2021-02-10 ENCOUNTER — Encounter: Payer: Self-pay | Admitting: Internal Medicine

## 2021-02-10 VITALS — BP 116/68 | HR 68 | Temp 98.0°F | Ht 67.0 in | Wt 163.0 lb

## 2021-02-10 DIAGNOSIS — E782 Mixed hyperlipidemia: Secondary | ICD-10-CM

## 2021-02-10 DIAGNOSIS — I25118 Atherosclerotic heart disease of native coronary artery with other forms of angina pectoris: Secondary | ICD-10-CM | POA: Diagnosis not present

## 2021-02-10 DIAGNOSIS — F17201 Nicotine dependence, unspecified, in remission: Secondary | ICD-10-CM

## 2021-02-10 DIAGNOSIS — R7303 Prediabetes: Secondary | ICD-10-CM | POA: Diagnosis not present

## 2021-02-10 NOTE — Progress Notes (Signed)
Date:  02/10/2021   Name:  Teresa Gutierrez   DOB:  1965/12/09   MRN:  [id number]   Chief Complaint: Hospitalization Follow-up (Pt is doing well today ) Hospital follow up.  Admitted to [facility] 01/25/21 - 01/27/21 for chest pain.  Underwent Cath showing RCA 20%, mid RCA 50%, 2nd Mrg 30%, 1st Mrg-1 90%, 1st Mrg-2 95%.  3 overlapping stents were placed to the marginal branch.  Brief/Interim Summary: 55 year old with past medical history significant for chronic pain, emphysema, tobacco use, hypertension, anxiety, hyperlipidemia who presents complaining of chest pain.  Patient reports intermittent chest pain for 3 weeks. She report chest pain as substernal,  radiation to the left arm and jaw.  Associated with exertion, relieved by rest. Evaluation in the ED: Patient  presented with significantly elevated hypertension, resolved spontaneously. Troponin elevated, the sinus rhythm no  ST elevation.     1-Unstable Angina.  Presents with chest pain, multiple risk factors, Troponin elevated: 26--53-30 Cardiology consulted.  ECHO normal EF. No wall motion abnormalities.  No need to start Heparin Gtt per cardiology. Continue with aspirin, statins.  Patient had episode of chest pain on 6-06. Elevated SBP 200, ST depression lead III. Received nitroglycerin with resolution of chest pain and improvement of BP.  Patient Underwent Heart Cath showed; One vessel coronary artery disease with 90% proximal and 95% mid large caliber obtuse marginal branch.  Successful PCI with overlapping 2.5 x 15 mm, 2.25 x 12 mm and 2.25 x 15 mm resolute Onyx stent proximal and mid OM 1. Patient was a started on beta-blocker, metoprolol.  She was also started on Brilinta.  She will need to be on Brilinta and aspirin for 1 year.  Subsequently she will need to be on aspirin. Will provide nitroglycerin tablet. She needs aggressive risk factors modification. Patient is chest pain-free, she was cleared by cardiology for discharge.    2-HTN; Continue with Irbesartan, decreased dose because patient was a started on metoprolol Monitor.    3-HLD; Triglyceride 400. Discussed diet with patient. Continue with statins, increase dose to 80 mg.  She will need repeated lipid panel to follow-up triglyceride levels.  She might benefit from Lopid.   4-Anxiety: Continue with Celexa.    Recommendations for Outpatient Follow-up:  Follow up with PCP in 1-2 weeks Please obtain BMP/CBC in one week Needs follow up with PCP to further discussed management of Pre-DM. Needs to follow up with cardiologist for further care post Stent. Needs repeat Lipid panel, consider lopid if triglycerides still elevated.   CAD - s/p DES x 3 as above.  Feels well, no chest pain or SOB.  No bleeding issues or side effects to medications. Seeing Cardiology in 2 days. Diabetes She presents for her follow-up diabetic visit. Diabetes type: prediabetes. Pertinent negatives for hypoglycemia include no dizziness, headaches or nervousness/anxiousness. Pertinent negatives for diabetes include no chest pain and no fatigue. Current diabetic treatment includes diet. She is compliant with treatment most of the time. An ACE inhibitor/angiotensin II receptor blocker is being taken.  Hyperlipidemia This is a chronic problem. The problem is resistant (with very high Triglycerides). Pertinent negatives include no chest pain or shortness of breath. Current antihyperlipidemic treatment includes statins.   Lab Results  Component Value Date   CREATININE 0.65 01/28/2021   BUN 13 01/28/2021   NA 139 01/28/2021   K 4.2 01/28/2021   CL 107 01/28/2021   CO2 23 01/28/2021   Lab Results  Component Value Date   CHOL 240 (  H) 01/26/2021   HDL 39 (L) 01/26/2021   LDLCALC UNABLE TO CALCULATE IF TRIGLYCERIDE OVER 400 mg/dL 67/67/2094   LDLDIRECT 138.2 (H) 01/26/2021   TRIG 411 (H) 01/26/2021   CHOLHDL 6.2 01/26/2021   Lab Results  Component Value Date   TSH 3.190 10/03/2020    Lab Results  Component Value Date   HGBA1C 6.0 (H) 01/26/2021   Lab Results  Component Value Date   WBC 10.5 01/28/2021   HGB 13.8 01/28/2021   HCT 41.5 01/28/2021   MCV 92.4 01/28/2021   PLT 245 01/28/2021   Lab Results  Component Value Date   ALT 29 01/25/2021   AST 35 01/25/2021   ALKPHOS 111 01/25/2021   BILITOT 1.0 01/25/2021     Review of Systems  Constitutional:  Negative for chills, fatigue and fever.  Respiratory:  Negative for cough, chest tightness and shortness of breath.   Cardiovascular:  Negative for chest pain, palpitations and leg swelling.  Neurological:  Negative for dizziness and headaches.  Hematological:  Negative for adenopathy. Does not bruise/bleed easily.  Psychiatric/Behavioral:  Negative for dysphoric mood and sleep disturbance. The patient is not nervous/anxious.    Patient Active Problem List   Diagnosis Date Noted   Angina at rest [facility]) 01/28/2021   Chest pain, rule out acute myocardial infarction 01/25/2021   S/P total hysterectomy 10/01/2020   Generalized anxiety disorder 09/01/2017   Eczema 04/20/2017   Back pain of lumbar region with sciatica 04/29/2016   Cervical disc disorder with radiculopathy 04/29/2016   Emphysema of lung (HCC) 12/09/2015   Solitary pulmonary nodule 12/09/2015   Hyperlipidemia 05/21/2015   Compulsive tobacco user syndrome 05/21/2015   Muscle spasms of neck 05/21/2015   Gastroesophageal reflux disease without esophagitis 05/21/2015   Essential hypertension 05/21/2015    Allergies  Allergen Reactions   Other     Bear meat   Wellbutrin [Bupropion]     hostility    Past Surgical History:  Procedure Laterality Date   ABDOMINAL HYSTERECTOMY  2014   ovaries remain   CORONARY STENT INTERVENTION N/A 01/27/2021   Procedure: CORONARY STENT INTERVENTION;  Surgeon: Marcina Millard, MD;  Location: [facility] INVASIVE CV LAB;  Service: Cardiovascular;  Laterality: N/A;  OM!   LEFT HEART CATH AND CORONARY  ANGIOGRAPHY N/A 01/27/2021   Procedure: LEFT HEART CATH AND CORONARY ANGIOGRAPHY;  Surgeon: Marcina Millard, MD;  Location: [facility] INVASIVE CV LAB;  Service: Cardiovascular;  Laterality: N/A;    Social History   Tobacco Use   Smoking status: Former    Packs/day: 0.25    Years: 38.00    Pack years: 9.50    Types: Cigarettes    Start date: 07/16/1982    Quit date: 01/27/2021    Years since quitting: 0.0   Smokeless tobacco: Never  Vaping Use   Vaping Use: Never used  Substance Use Topics   Alcohol use: Yes    Alcohol/week: 2.0 standard drinks    Types: 2 Standard drinks or equivalent per week   Drug use: No     Medication list has been reviewed and updated.  Current Meds  Medication Sig   aspirin 81 MG chewable tablet Chew 1 tablet (81 mg total) by mouth daily.   atorvastatin (LIPITOR) 80 MG tablet Take 1 tablet (80 mg total) by mouth daily.   citalopram (CELEXA) 10 MG tablet Take 1 tablet (10 mg total) by mouth daily.   irbesartan (AVAPRO) 300 MG tablet Take 0.5 tablets (150 mg total) by mouth  daily.   metoprolol tartrate (LOPRESSOR) 25 MG tablet Take 1 tablet (25 mg total) by mouth 2 (two) times daily.   nitroGLYCERIN (NITROSTAT) 0.4 MG SL tablet Place 1 tablet (0.4 mg total) under the tongue every 5 (five) minutes as needed for chest pain.   ticagrelor (BRILINTA) 90 MG TABS tablet Take 1 tablet (90 mg total) by mouth 2 (two) times daily.   vitamin B-12 (CYANOCOBALAMIN) 500 MCG tablet Take 500 mcg by mouth daily.    PHQ 2/9 Scores 02/10/2021 10/03/2020 03/07/2020 09/07/2019  PHQ - 2 Score 0 0 0 0  PHQ- 9 Score 3 0 0 0    GAD 7 : Generalized Anxiety Score 02/10/2021 10/03/2020 03/07/2020 04/20/2017  Nervous, Anxious, on Edge 0 0 0 2  Control/stop worrying 0 0 0 2  Worry too much - different things 0 0 0 2  Trouble relaxing 0 0 0 2  Restless 0 0 0 2  Easily annoyed or irritable 0 0 0 3  Afraid - awful might happen 0 0 0 0  Total GAD 7 Score 0 0 0 13  Anxiety Difficulty -  Not difficult at all Not difficult at all Extremely difficult    BP Readings from Last 3 Encounters:  02/10/21 116/68  01/28/21 (!) 145/77  12/27/20 130/84    Physical Exam Vitals and nursing note reviewed.  Constitutional:      General: She is not in acute distress.    Appearance: Normal appearance. She is well-developed.  HENT:     Head: Normocephalic and atraumatic.  Neck:     Vascular: No carotid bruit.  Cardiovascular:     Rate and Rhythm: Normal rate and regular rhythm.     Pulses: Normal pulses.     Heart sounds: No murmur heard. Pulmonary:     Effort: Pulmonary effort is normal. No respiratory distress.     Breath sounds: No wheezing or rhonchi.  Musculoskeletal:     Cervical back: Normal range of motion.     Right lower leg: No edema.     Left lower leg: No edema.  Lymphadenopathy:     Cervical: No cervical adenopathy.  Skin:    General: Skin is warm and dry.     Findings: No rash.  Neurological:     Mental Status: She is alert and oriented to person, place, and time.  Psychiatric:        Mood and Affect: Mood normal.        Behavior: Behavior normal.    Wt Readings from Last 3 Encounters:  02/10/21 163 lb (73.9 kg)  01/27/21 162 lb 12.8 oz (73.8 kg)  12/27/20 160 lb (72.6 kg)    BP 116/68   Pulse 68   Temp 98 F (36.7 C) (Oral)   Ht 5\' 7"  (1.702 m)   Wt 163 lb (73.9 kg)   SpO2 98%   BMI 25.53 kg/m   Assessment and Plan: 1. Coronary artery disease of native artery of native heart with stable angina pectoris [facility](HCC) Doing well s/p DESx3 Tolerating medications without side effects Trigs were too high to determine LDL in the hospital - she will need to repeat these fasting Follow up with Cardiology as planned - CBC with Differential/Platelet - Basic metabolic panel  2. Mixed hyperlipidemia Continue atorvastatin 80 mg. Recommend limiting fats and repeat fasting or with direct LDL Consider adding Fenofibrate  3. Prediabetes Recent A1C was  6.0 Discussed limiting carbs and recheck in August at next visit  4.  Tobacco use disorder, severe, in early remission Pt is congratulated on quitting smoking just recently   Partially dictated using Animal nutritionist. Any errors are unintentional.  Bari Edward, MD [facility]  02/10/2021

## 2021-02-11 LAB — CBC WITH DIFFERENTIAL/PLATELET
Basophils Absolute: 0.1 10*3/uL (ref 0.0–0.2)
Basos: 1 %
EOS (ABSOLUTE): 0.2 10*3/uL (ref 0.0–0.4)
Eos: 2 %
Hematocrit: 36.4 % (ref 34.0–46.6)
Hemoglobin: 12.4 g/dL (ref 11.1–15.9)
Immature Grans (Abs): 0.1 10*3/uL (ref 0.0–0.1)
Immature Granulocytes: 1 %
Lymphocytes Absolute: 2.1 10*3/uL (ref 0.7–3.1)
Lymphs: 19 %
MCH: 30.8 pg (ref 26.6–33.0)
MCHC: 34.1 g/dL (ref 31.5–35.7)
MCV: 90 fL (ref 79–97)
Monocytes Absolute: 0.7 10*3/uL (ref 0.1–0.9)
Monocytes: 6 %
Neutrophils Absolute: 7.9 10*3/uL — ABNORMAL HIGH (ref 1.4–7.0)
Neutrophils: 71 %
Platelets: 318 10*3/uL (ref 150–450)
RBC: 4.03 x10E6/uL (ref 3.77–5.28)
RDW: 12.6 % (ref 11.7–15.4)
WBC: 10.9 10*3/uL — ABNORMAL HIGH (ref 3.4–10.8)

## 2021-02-11 LAB — BASIC METABOLIC PANEL
BUN/Creatinine Ratio: 28 — ABNORMAL HIGH (ref 9–23)
BUN: 18 mg/dL (ref 6–24)
CO2: 25 mmol/L (ref 20–29)
Calcium: 10 mg/dL (ref 8.7–10.2)
Chloride: 100 mmol/L (ref 96–106)
Creatinine, Ser: 0.64 mg/dL (ref 0.57–1.00)
Glucose: 97 mg/dL (ref 65–99)
Potassium: 4.2 mmol/L (ref 3.5–5.2)
Sodium: 139 mmol/L (ref 134–144)
eGFR: 105 mL/min/{1.73_m2} (ref >59)

## 2021-02-12 DIAGNOSIS — I25118 Atherosclerotic heart disease of native coronary artery with other forms of angina pectoris: Secondary | ICD-10-CM | POA: Diagnosis not present

## 2021-02-12 DIAGNOSIS — I10 Essential (primary) hypertension: Secondary | ICD-10-CM | POA: Diagnosis not present

## 2021-02-12 DIAGNOSIS — R0789 Other chest pain: Secondary | ICD-10-CM | POA: Diagnosis not present

## 2021-02-12 DIAGNOSIS — E782 Mixed hyperlipidemia: Secondary | ICD-10-CM | POA: Diagnosis not present

## 2021-03-28 ENCOUNTER — Other Ambulatory Visit: Payer: Self-pay | Admitting: Internal Medicine

## 2021-03-28 ENCOUNTER — Telehealth: Payer: Self-pay

## 2021-03-28 DIAGNOSIS — E782 Mixed hyperlipidemia: Secondary | ICD-10-CM

## 2021-03-28 DIAGNOSIS — I25118 Atherosclerotic heart disease of native coronary artery with other forms of angina pectoris: Secondary | ICD-10-CM

## 2021-03-28 MED ORDER — ATORVASTATIN CALCIUM 80 MG PO TABS: 80.0000 mg | ORAL_TABLET | Freq: Every day | ORAL | 0 refills | Status: DC

## 2021-03-28 NOTE — Telephone Encounter (Signed)
Copied from CRM [id number]. Topic: Quick Communication - Rx Refill/Question >> Mar 28, 2021 12:22 PM Pawlus, Maxine Glenn A wrote: Pt wanted to know if Dr Judithann Graves would continue prescribing her atorvastatin (LIPITOR) 80 MG tablet, Rx was originally prescribed by a different provider. >> Mar 28, 2021 12:26 PM Pawlus, Maxine Glenn A wrote: Pt has upcoming appt 8/18 but has run out [employer]

## 2021-04-10 ENCOUNTER — Encounter: Payer: Self-pay | Admitting: Internal Medicine

## 2021-04-10 ENCOUNTER — Ambulatory Visit: Payer: BC Managed Care – PPO | Admitting: Internal Medicine

## 2021-04-10 ENCOUNTER — Other Ambulatory Visit: Payer: Self-pay

## 2021-04-10 VITALS — BP 84/78 | HR 54 | Temp 97.7°F | Ht 67.0 in | Wt 158.0 lb

## 2021-04-10 DIAGNOSIS — I10 Essential (primary) hypertension: Secondary | ICD-10-CM | POA: Diagnosis not present

## 2021-04-10 DIAGNOSIS — E782 Mixed hyperlipidemia: Secondary | ICD-10-CM

## 2021-04-10 DIAGNOSIS — Z23 Encounter for immunization: Secondary | ICD-10-CM | POA: Diagnosis not present

## 2021-04-10 DIAGNOSIS — R7303 Prediabetes: Secondary | ICD-10-CM

## 2021-04-10 NOTE — Progress Notes (Signed)
Date:  04/10/2021   Name:  Teresa Gutierrez   DOB:  01/23/1966   MRN:  [id number]   Chief Complaint: Hypertension  Hypertension This is a chronic problem. The problem is controlled. Pertinent negatives include no chest pain, headaches or shortness of breath. Past treatments include angiotensin blockers and beta blockers. The current treatment provides significant improvement.  Hyperlipidemia This is a chronic problem. The problem is uncontrolled. Recent lipid tests were reviewed and are high. Pertinent negatives include no chest pain or shortness of breath.  Diabetes She presents for her follow-up diabetic visit. Diabetes type: prediabetes. Her disease course has been stable. Pertinent negatives for hypoglycemia include no dizziness, headaches or nervousness/anxiousness. Pertinent negatives for diabetes include no chest pain, no fatigue and no weakness. Her weight is decreasing steadily. She is following a generally healthy diet. Her breakfast blood glucose is taken between 6-7 am. Her breakfast blood glucose range is generally 90-110 mg/dl. An ACE inhibitor/angiotensin II receptor blocker is being taken.   Lab Results  Component Value Date   CREATININE 0.64 02/10/2021   BUN 18 02/10/2021   NA 139 02/10/2021   K 4.2 02/10/2021   CL 100 02/10/2021   CO2 25 02/10/2021   Lab Results  Component Value Date   CHOL 240 (H) 01/26/2021   HDL 39 (L) 01/26/2021   LDLCALC UNABLE TO CALCULATE IF TRIGLYCERIDE OVER 400 mg/dL 78/29/5621   LDLDIRECT 138.2 (H) 01/26/2021   TRIG 411 (H) 01/26/2021   CHOLHDL 6.2 01/26/2021   Lab Results  Component Value Date   TSH 3.190 10/03/2020   Lab Results  Component Value Date   HGBA1C 6.0 (H) 01/26/2021   Lab Results  Component Value Date   WBC 10.9 (H) 02/10/2021   HGB 12.4 02/10/2021   HCT 36.4 02/10/2021   MCV 90 02/10/2021   PLT 318 02/10/2021   Lab Results  Component Value Date   ALT 29 01/25/2021   AST 35 01/25/2021   ALKPHOS 111  01/25/2021   BILITOT 1.0 01/25/2021     Review of Systems  Constitutional:  Negative for chills, fatigue and fever.  Respiratory:  Negative for chest tightness, shortness of breath and wheezing.   Cardiovascular:  Negative for chest pain and leg swelling.  Neurological:  Negative for dizziness, weakness and headaches.  Psychiatric/Behavioral:  Negative for dysphoric mood and sleep disturbance. The patient is not nervous/anxious.    Patient Active Problem List   Diagnosis Date Noted   Coronary artery disease of native artery of native heart with stable angina pectoris (HCC) 02/10/2021   Prediabetes 02/10/2021   S/P total hysterectomy 10/01/2020   Generalized anxiety disorder 09/01/2017   Eczema 04/20/2017   Back pain of lumbar region with sciatica 04/29/2016   Cervical disc disorder with radiculopathy 04/29/2016   Emphysema of lung (HCC) 12/09/2015   Solitary pulmonary nodule 12/09/2015   Hyperlipidemia 05/21/2015   Tobacco use disorder, severe, in early remission 05/21/2015   Muscle spasms of neck 05/21/2015   Gastroesophageal reflux disease without esophagitis 05/21/2015   Essential hypertension 05/21/2015    Allergies  Allergen Reactions   Other     Bear meat   Wellbutrin [Bupropion]     hostility    Past Surgical History:  Procedure Laterality Date   ABDOMINAL HYSTERECTOMY  2014   ovaries remain   CORONARY STENT INTERVENTION N/A 01/27/2021   Procedure: CORONARY STENT INTERVENTION;  Surgeon: Marcina Millard, MD;  Location: [facility] INVASIVE CV LAB;  Service: Cardiovascular;  Laterality: N/A;  OM!   LEFT HEART CATH AND CORONARY ANGIOGRAPHY N/A 01/27/2021   Procedure: LEFT HEART CATH AND CORONARY ANGIOGRAPHY;  Surgeon: Marcina Millard, MD;  Location: [facility] INVASIVE CV LAB;  Service: Cardiovascular;  Laterality: N/A;    Social History   Tobacco Use   Smoking status: Former    Packs/day: 0.25    Years: 38.00    Pack years: 9.50    Types: Cigarettes    Start  date: 07/16/1982    Quit date: 01/27/2021    Years since quitting: 0.2   Smokeless tobacco: Never  Vaping Use   Vaping Use: Never used  Substance Use Topics   Alcohol use: Yes    Alcohol/week: 2.0 standard drinks    Types: 2 Standard drinks or equivalent per week   Drug use: No     Medication list has been reviewed and updated.  Current Meds  Medication Sig   aspirin 81 MG chewable tablet Chew 1 tablet (81 mg total) by mouth daily.   atorvastatin (LIPITOR) 80 MG tablet Take 1 tablet (80 mg total) by mouth daily.   citalopram (CELEXA) 10 MG tablet Take 1 tablet (10 mg total) by mouth daily.   irbesartan (AVAPRO) 300 MG tablet Take 0.5 tablets (150 mg total) by mouth daily.   metoprolol tartrate (LOPRESSOR) 25 MG tablet Take 1 tablet (25 mg total) by mouth 2 (two) times daily.   nitroGLYCERIN (NITROSTAT) 0.4 MG SL tablet Place 1 tablet (0.4 mg total) under the tongue every 5 (five) minutes as needed for chest pain.   ticagrelor (BRILINTA) 90 MG TABS tablet Take 1 tablet (90 mg total) by mouth 2 (two) times daily.   vitamin B-12 (CYANOCOBALAMIN) 500 MCG tablet Take 500 mcg by mouth daily.    PHQ 2/9 Scores 02/10/2021 10/03/2020 03/07/2020 09/07/2019  PHQ - 2 Score 0 0 0 0  PHQ- 9 Score 3 0 0 0    GAD 7 : Generalized Anxiety Score 02/10/2021 10/03/2020 03/07/2020 04/20/2017  Nervous, Anxious, on Edge 0 0 0 2  Control/stop worrying 0 0 0 2  Worry too much - different things 0 0 0 2  Trouble relaxing 0 0 0 2  Restless 0 0 0 2  Easily annoyed or irritable 0 0 0 3  Afraid - awful might happen 0 0 0 0  Total GAD 7 Score 0 0 0 13  Anxiety Difficulty - Not difficult at all Not difficult at all Extremely difficult    BP Readings from Last 3 Encounters:  04/10/21 94/70  02/10/21 116/68  01/28/21 (!) 145/77    Physical Exam Vitals and nursing note reviewed.  Constitutional:      General: She is not in acute distress.    Appearance: Normal appearance. She is well-developed.  HENT:      Head: Normocephalic and atraumatic.  Neck:     Vascular: No carotid bruit.  Cardiovascular:     Rate and Rhythm: Normal rate and regular rhythm.     Pulses: Normal pulses.     Heart sounds: No murmur heard. Pulmonary:     Effort: Pulmonary effort is normal. No respiratory distress.     Breath sounds: No wheezing or rhonchi.  Musculoskeletal:     Cervical back: Normal range of motion.     Right lower leg: No edema.     Left lower leg: No edema.  Lymphadenopathy:     Cervical: No cervical adenopathy.  Skin:    General: Skin is warm and dry.  Findings: No rash.  Neurological:     General: No focal deficit present.     Mental Status: She is alert and oriented to person, place, and time.  Psychiatric:        Mood and Affect: Mood normal.        Behavior: Behavior normal.    Wt Readings from Last 3 Encounters:  04/10/21 158 lb (71.7 kg)  02/10/21 163 lb (73.9 kg)  01/27/21 162 lb 12.8 oz (73.8 kg)    BP 94/70 (BP Location: Right Arm, Patient Position: Sitting, Cuff Size: Normal)   Pulse (!) 54   Temp 97.7 F (36.5 C) (Oral)   Ht 5\' 7"  (1.702 m)   Wt 158 lb (71.7 kg)   SpO2 99%   BMI 24.75 kg/m   Assessment and Plan: 1. Essential hypertension Clinically stable exam with well controlled BP. Tolerating medications without side effects at this time.  May need to reduce dose of Avapro if BP remains below 100 systolic. Pt to continue current regimen and low sodium diet; benefits of regular exercise as able discussed.  2. Prediabetes Doing well with diet, exercise and weight loss. - Hemoglobin A1c  3. Mixed hyperlipidemia On high dose Lipitor but Triglycerides are also high. Will repeat panel with direct LDL to further evaluation - Lipid panel - Direct LDL  4. Need for vaccination for pneumococcus - Pneumococcal conjugate vaccine 20-valent   Partially dictated using . Any errors are unintentional.  Animal nutritionist, MD [facility]  04/10/2021

## 2021-04-11 LAB — HEMOGLOBIN A1C
Est. average glucose Bld gHb Est-mCnc: 111 mg/dL
Hgb A1c MFr Bld: 5.5 % (ref 4.8–5.6)

## 2021-04-11 LAB — LDL CHOLESTEROL, DIRECT: LDL Direct: 64 mg/dL (ref 0–99)

## 2021-04-11 LAB — LIPID PANEL
Chol/HDL Ratio: 3.5 ratio (ref 0.0–4.4)
Cholesterol, Total: 140 mg/dL (ref 100–199)
HDL: 40 mg/dL (ref >39)
LDL Chol Calc (NIH): 68 mg/dL (ref 0–99)
Triglycerides: 188 mg/dL — ABNORMAL HIGH (ref 0–149)
VLDL Cholesterol Cal: 32 mg/dL (ref 5–40)

## 2021-05-31 IMAGING — MG MM DIGITAL SCREENING BILAT W/ TOMO AND CAD
8 series · 8 of 24 positions shown · non-contrast
Comparison: Previous exam(s).

CLINICAL DATA: Screening.

EXAM:
DIGITAL SCREENING BILATERAL MAMMOGRAM WITH TOMOSYNTHESIS AND CAD
TECHNIQUE: Bilateral screening digital craniocaudal and mediolateral oblique
mammograms were obtained. Bilateral screening digital breast
tomosynthesis was performed. The images were evaluated with
computer-aided detection.

[L MLO synth-2D]
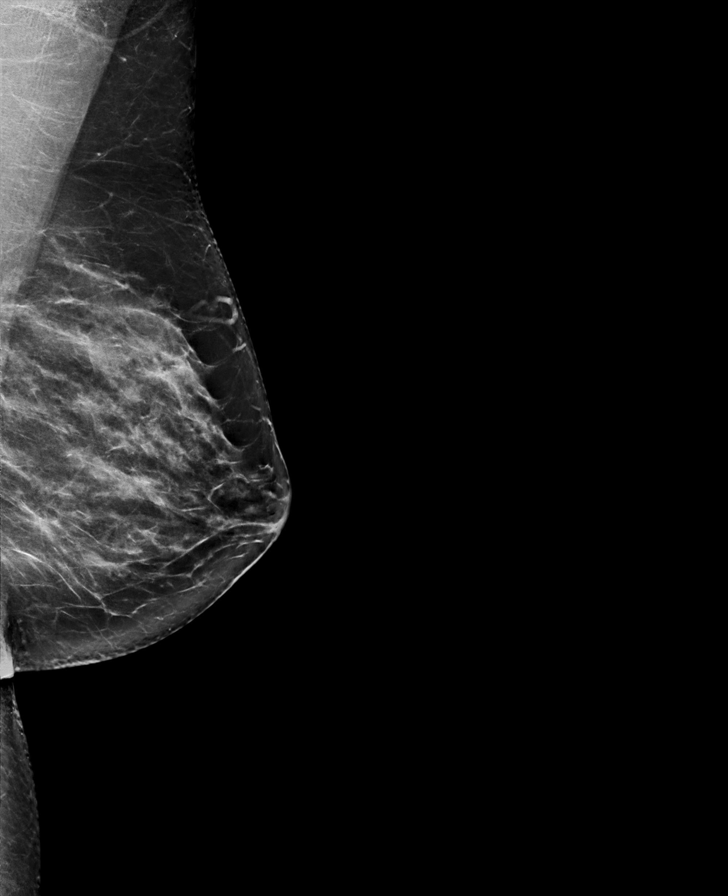

[R MLO synth-2D]
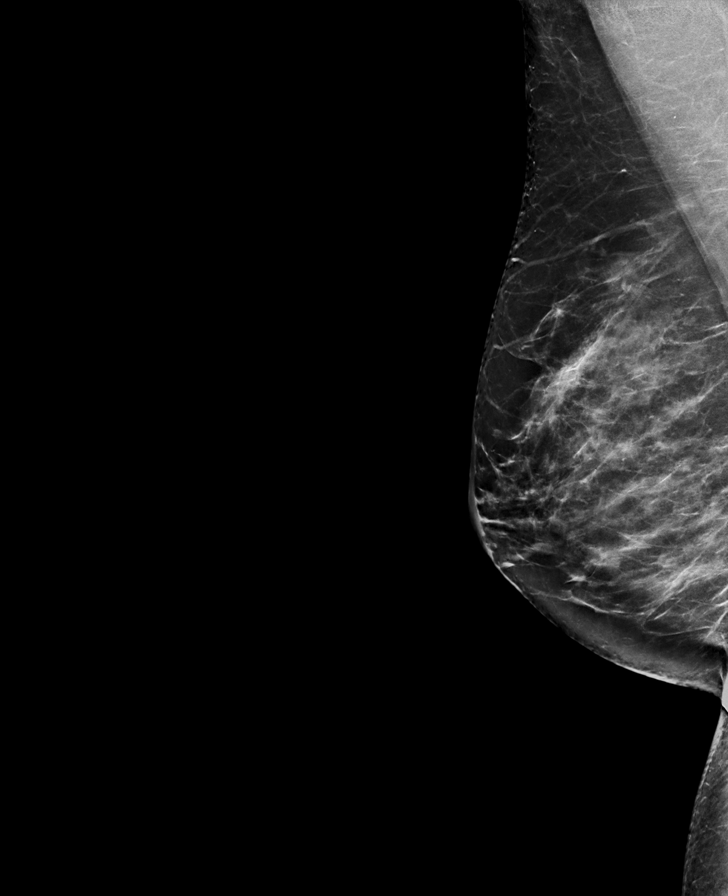

[L CC synth-2D]
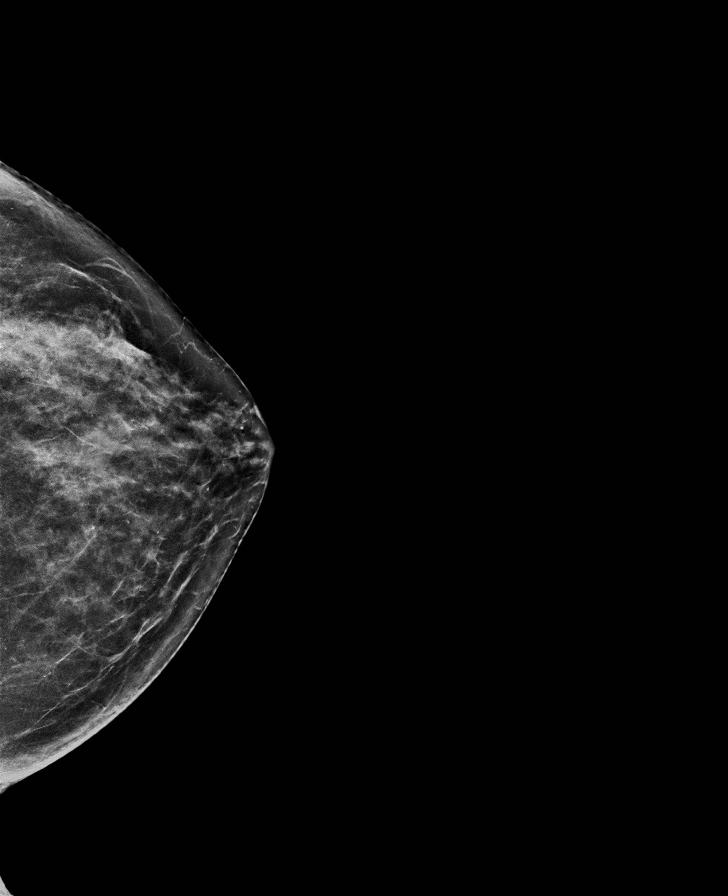

[R CC synth-2D]
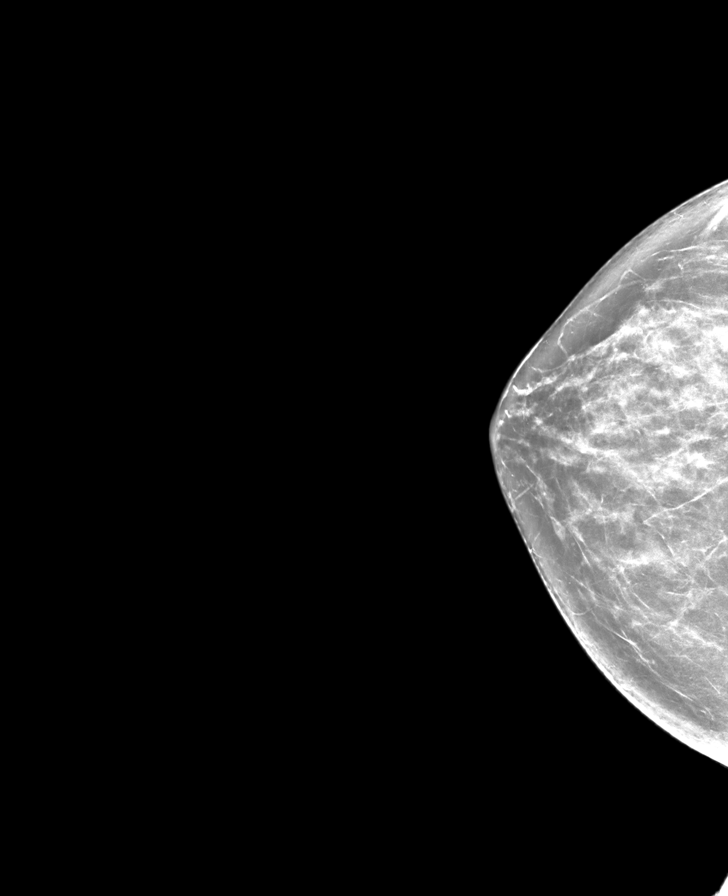

[R MLO tomo · tomo slice 41/81.0]
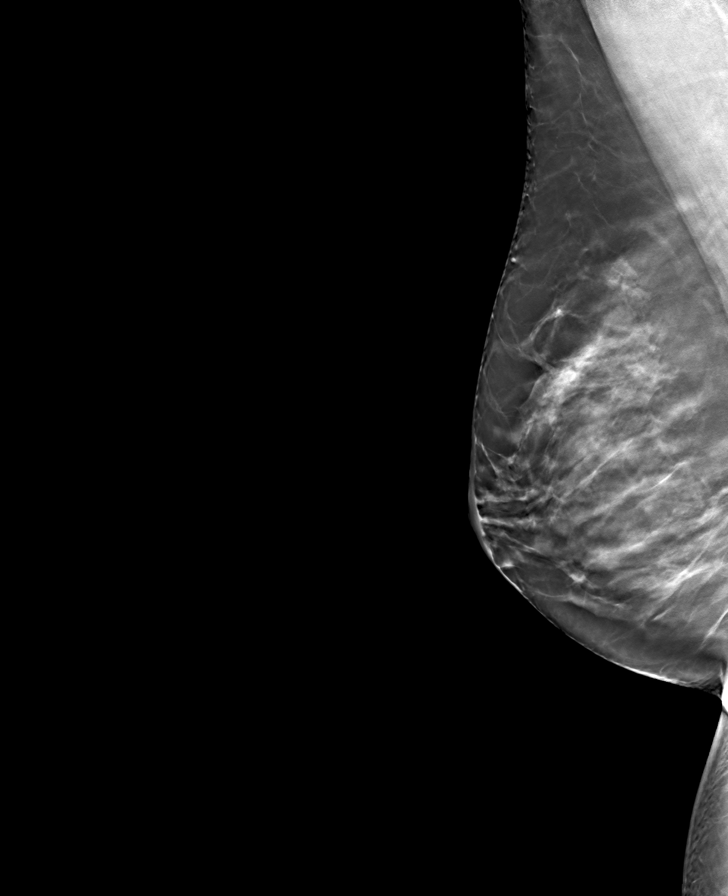

[L MLO tomo · tomo slice 43/84.0]
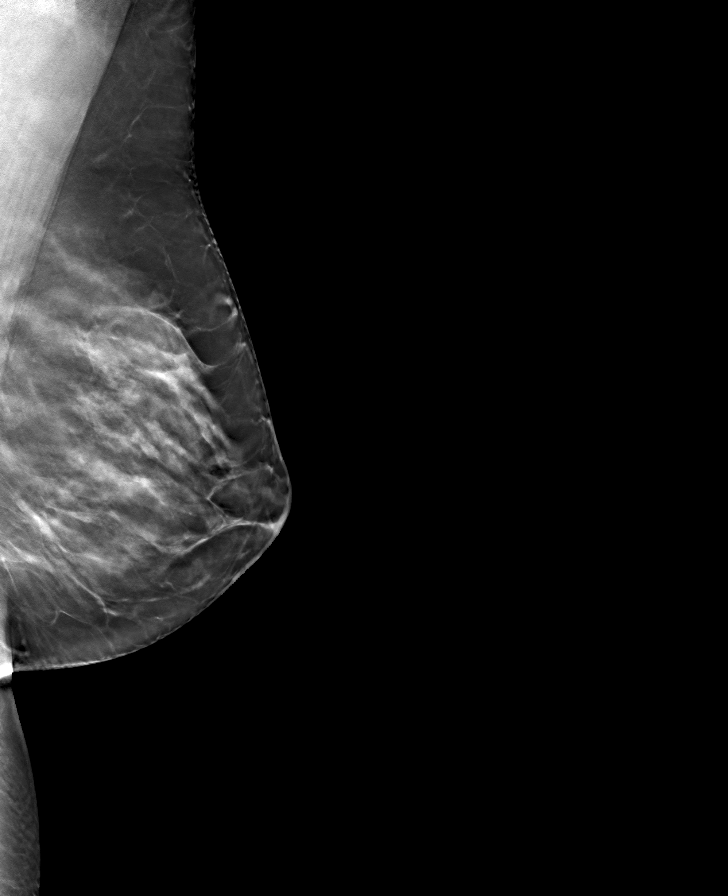

[R CC tomo · tomo slice 39/77.0]
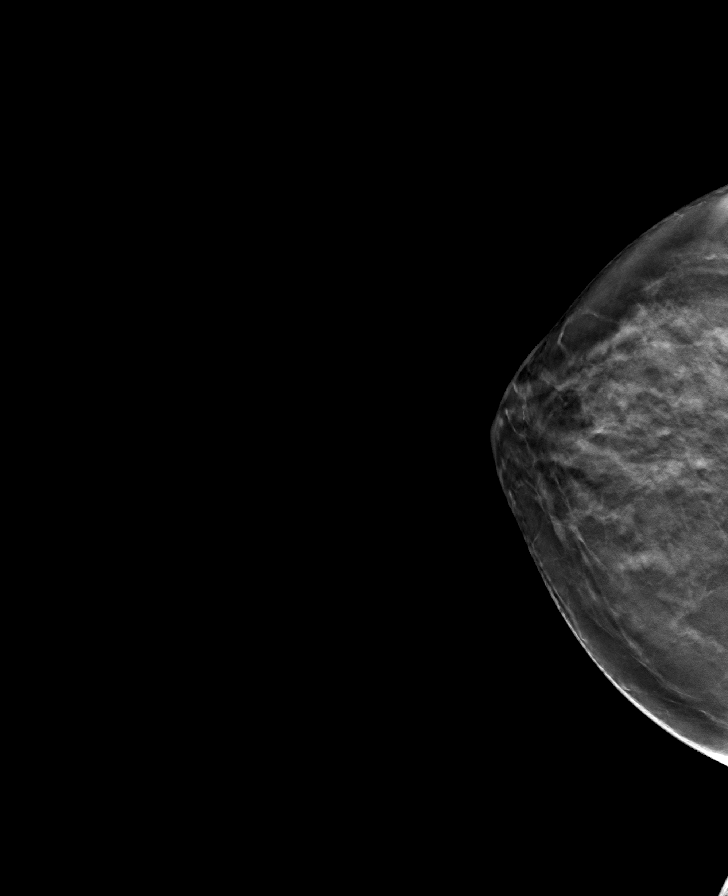

[L CC tomo · tomo slice 35/70.0]
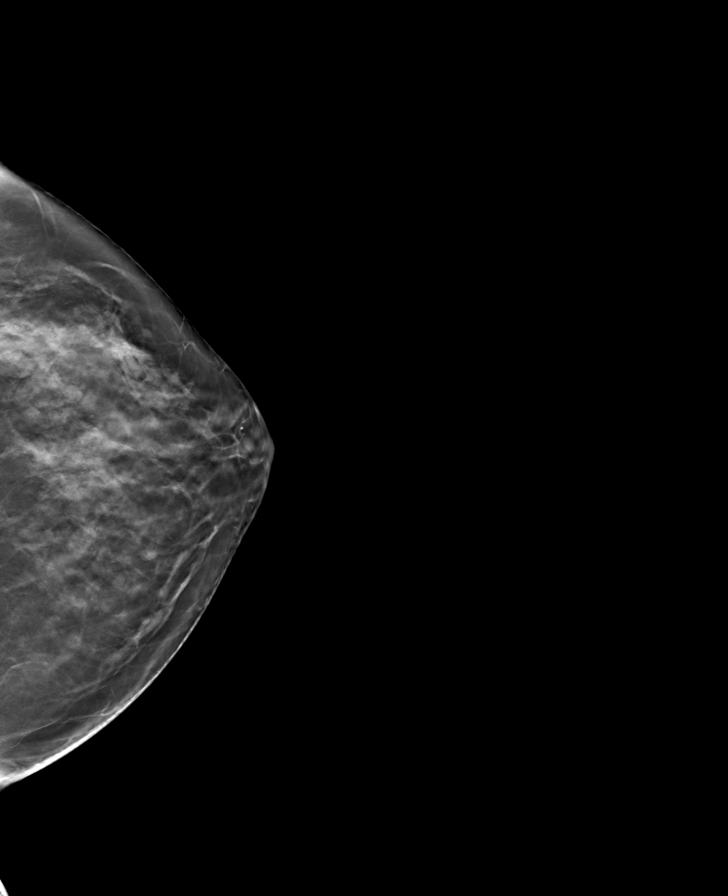

[8 of 24 positions shown; findings below may reference images not displayed]

ACR Breast Density Category c: The breast tissue is heterogeneously
dense, which may obscure small masses.
FINDINGS: There are no findings suspicious for malignancy. The images were
evaluated with computer-aided detection.
IMPRESSION: No mammographic evidence of malignancy. A result letter of this
screening mammogram will be mailed directly to the patient.

RECOMMENDATION:
Screening mammogram in one year. (Code:T4-5-GWO)

BI-RADS CATEGORY  1: Negative.

## 2021-06-02 DIAGNOSIS — J439 Emphysema, unspecified: Secondary | ICD-10-CM | POA: Diagnosis not present

## 2021-06-02 DIAGNOSIS — E782 Mixed hyperlipidemia: Secondary | ICD-10-CM | POA: Diagnosis not present

## 2021-06-02 DIAGNOSIS — I10 Essential (primary) hypertension: Secondary | ICD-10-CM | POA: Diagnosis not present

## 2021-06-02 DIAGNOSIS — I25118 Atherosclerotic heart disease of native coronary artery with other forms of angina pectoris: Secondary | ICD-10-CM | POA: Diagnosis not present

## 2021-06-03 ENCOUNTER — Other Ambulatory Visit: Payer: Self-pay | Admitting: Internal Medicine

## 2021-06-03 DIAGNOSIS — F411 Generalized anxiety disorder: Secondary | ICD-10-CM

## 2021-06-03 NOTE — Telephone Encounter (Signed)
Requested Prescriptions  Pending Prescriptions Disp Refills  . citalopram (CELEXA) 10 MG tablet [Pharmacy Med Name: Citalopram Hydrobromide 10 MG Oral Tablet] 90 tablet 0    Sig: Take 1 tablet by mouth once daily     Psychiatry:  Antidepressants - SSRI Passed - 06/03/2021  8:24 AM      Passed - Valid encounter within last 6 months    Recent Outpatient Visits          1 month ago Essential hypertension   [facility] Reubin Milan, MD   3 months ago Coronary artery disease of native artery of native heart with stable angina pectoris [facility])   [facility] Reubin Milan, MD   8 months ago Annual physical exam   [facility] Reubin Milan, MD   1 year ago Essential hypertension   [facility] Reubin Milan, MD   1 year ago Annual physical exam   [facility] Reubin Milan, MD      Future Appointments            In 4 months Judithann Graves Nyoka Cowden, MD [facility]

## 2021-06-30 ENCOUNTER — Other Ambulatory Visit: Payer: Self-pay | Admitting: Internal Medicine

## 2021-06-30 DIAGNOSIS — I25118 Atherosclerotic heart disease of native coronary artery with other forms of angina pectoris: Secondary | ICD-10-CM

## 2021-06-30 DIAGNOSIS — E782 Mixed hyperlipidemia: Secondary | ICD-10-CM

## 2021-06-30 MED ORDER — ATORVASTATIN CALCIUM 80 MG PO TABS: 80.0000 mg | ORAL_TABLET | Freq: Every day | ORAL | 0 refills | Status: DC

## 2021-06-30 NOTE — Telephone Encounter (Signed)
Requested Prescriptions  Pending Prescriptions Disp Refills  . atorvastatin (LIPITOR) 80 MG tablet 90 tablet 0    Sig: Take 1 tablet (80 mg total) by mouth daily.     Cardiovascular:  Antilipid - Statins Failed - 06/30/2021  3:43 PM      Failed - Triglycerides in normal range and within 360 days    Triglycerides  Date Value Ref Range Status  04/10/2021 188 (H) 0 - 149 mg/dL Final         Passed - Total Cholesterol in normal range and within 360 days    Cholesterol, Total  Date Value Ref Range Status  04/10/2021 140 100 - 199 mg/dL Final         Passed - LDL in normal range and within 360 days    LDL Chol Calc (NIH)  Date Value Ref Range Status  04/10/2021 68 0 - 99 mg/dL Final   Direct LDL  Date Value Ref Range Status  01/26/2021 138.2 (H) 0 - 99 mg/dL Final    Comment:    Performed at [facility], [address] [place], [place] [address]   LDL Direct  Date Value Ref Range Status  04/10/2021 64 0 - 99 mg/dL Final         Passed - HDL in normal range and within 360 days    HDL  Date Value Ref Range Status  04/10/2021 40 >39 mg/dL Final         Passed - Patient is not pregnant      Passed - Valid encounter within last 12 months    Recent Outpatient Visits          2 months ago Essential hypertension   [facility] Reubin Milan, MD   4 months ago Coronary artery disease of native artery of native heart with stable angina pectoris [facility])   [facility] Reubin Milan, MD   9 months ago Annual physical exam   [facility] Reubin Milan, MD   1 year ago Essential hypertension   [facility] Reubin Milan, MD   1 year ago Annual physical exam   [facility] Reubin Milan, MD      Future Appointments            In 3 months Judithann Graves Nyoka Cowden, MD [facility]

## 2021-06-30 NOTE — Telephone Encounter (Signed)
Copied from CRM [id number]. Topic: Quick Communication - Rx Refill/Question >> Jun 30, 2021  3:38 PM Marylen Ponto wrote: Medication: atorvastatin (LIPITOR) 80 MG tablet  Has the patient contacted their pharmacy? Yes.  Pt told to contact provider (Agent: If no, request that the patient contact the pharmacy for the refill. If patient does not wish to contact the pharmacy document the reason why and proceed with request.) (Agent: If yes, when and what did the pharmacy advise?)  Preferred Pharmacy (with phone number or street name): [facility] [address] [place] - [address] Phone: [phone]   Fax: [phone]  Has the patient been seen for an appointment in the last year OR does the patient have an upcoming appointment? Yes.    Agent: Please be advised that RX refills may take up to 3 business days. We ask that you follow-up with your pharmacy.

## 2021-07-29 ENCOUNTER — Telehealth: Payer: Self-pay

## 2021-07-29 NOTE — Telephone Encounter (Signed)
Copied from CRM [id number]. Topic: General - Inquiry >> Jul 29, 2021 12:19 PM Aretta Nip wrote: Ptr is requesting Dr B take these meds over instead of her cardiologist... listed below  Medication Refill - Medication: ticagrelor (BRILINTA) 90 MG TABS tablet 60 tablet 6 01/28/2021   Sig - Route: Take 1 tablet (90 mg total) by mouth 2 (two) times daily. - Oral  Sent to pharmacy as: ticagrelor (BRILINTA) 90 MG Tab tablet  E-Prescribing Status: Receipt confirmed by pharmacy (01/28/2021 8:48 AM EDT)   metoprolol tartrate (LOPRESSOR) 25 MG tablet 60 tablet 5 01/28/2021   Sig - Route: Take 1 tablet (25 mg total) by mouth 2 (two) times daily. - Oral  Sent to pharmacy as: metoprolol tartrate (LOPRESSOR) 25 MG tablet  E-Prescribing Status: Receipt confirmed by pharmacy (01/28/2021 8:48 AM EDT   [id number] - [place], [place] - [address]  Phone:  [phone] Fax:  [phone]     Has the patient contacted their pharmacy? Yes.   (Agent: If no, request that the patient contact the pharmacy for the refill. If patient does not wish to contact the pharmacy document the reason why and proceed with request.) (Agent: If yes, when and what did the pharmacy advise?)  Has the patient been seen for an appointment in the last year OR does the patient have an upcoming appointment? Yes.    Agent: Please be advised that RX refills may take up to 3 business days. We ask that you follow-up with your pharmacy.

## 2021-09-25 ENCOUNTER — Other Ambulatory Visit: Payer: Self-pay | Admitting: Internal Medicine

## 2021-09-25 DIAGNOSIS — I25118 Atherosclerotic heart disease of native coronary artery with other forms of angina pectoris: Secondary | ICD-10-CM

## 2021-09-25 DIAGNOSIS — E782 Mixed hyperlipidemia: Secondary | ICD-10-CM

## 2021-09-25 NOTE — Telephone Encounter (Signed)
Requested Prescriptions  Pending Prescriptions Disp Refills   atorvastatin (LIPITOR) 80 MG tablet [Pharmacy Med Name: Atorvastatin Calcium 80 MG Oral Tablet] 90 tablet 0    Sig: Take 1 tablet by mouth once daily     Cardiovascular:  Antilipid - Statins Failed - 09/25/2021 10:22 AM      Failed - Lipid Panel in normal range within the last 12 months    Cholesterol, Total  Date Value Ref Range Status  04/10/2021 140 100 - 199 mg/dL Final   LDL Chol Calc (NIH)  Date Value Ref Range Status  04/10/2021 68 0 - 99 mg/dL Final   Direct LDL  Date Value Ref Range Status  01/26/2021 138.2 (H) 0 - 99 mg/dL Final    Comment:    Performed at [facility], [address] [place], [place] [address]   LDL Direct  Date Value Ref Range Status  04/10/2021 64 0 - 99 mg/dL Final   HDL  Date Value Ref Range Status  04/10/2021 40 >39 mg/dL Final   Triglycerides  Date Value Ref Range Status  04/10/2021 188 (H) 0 - 149 mg/dL Final         Passed - Patient is not pregnant      Passed - Valid encounter within last 12 months    Recent Outpatient Visits          5 months ago Essential hypertension   [facility] Reubin Milan, MD   7 months ago Coronary artery disease of native artery of native heart with stable angina pectoris [facility])   [facility] Reubin Milan, MD   11 months ago Annual physical exam   [facility] Reubin Milan, MD   1 year ago Essential hypertension   [facility] Reubin Milan, MD   2 years ago Annual physical exam   [facility] Reubin Milan, MD      Future Appointments            In 2 weeks Judithann Graves Nyoka Cowden, MD [facility]

## 2021-10-09 ENCOUNTER — Encounter: Payer: BC Managed Care – PPO | Admitting: Internal Medicine

## 2021-10-26 ENCOUNTER — Other Ambulatory Visit: Payer: Self-pay | Admitting: Internal Medicine

## 2021-10-26 DIAGNOSIS — F411 Generalized anxiety disorder: Secondary | ICD-10-CM

## 2021-10-28 NOTE — Telephone Encounter (Signed)
Requested Prescriptions  ?Pending Prescriptions Disp Refills  ?? citalopram (CELEXA) 10 MG tablet [Pharmacy Med Name: Citalopram Hydrobromide 10 MG Oral Tablet] 90 tablet 0  ?  Sig: Take 1 tablet by mouth once daily  ?  ? Psychiatry:  Antidepressants - SSRI Failed - 10/26/2021  3:28 PM  ?  ?  Failed - Valid encounter within last 6 months  ?  Recent Outpatient Visits   ?      ? 6 months ago Essential hypertension  ? [facility] Glean Hess, MD  ? 8 months ago Coronary artery disease of native artery of native heart with stable angina pectoris [facility])  ? [facility] Glean Hess, MD  ? 1 year ago Annual physical exam  ? [facility] Glean Hess, MD  ? 1 year ago Essential hypertension  ? [facility] Glean Hess, MD  ? 2 years ago Annual physical exam  ? [facility] Glean Hess, MD  ?  ?  ?Future Appointments   ?        ? In 4 weeks Glean Hess, MD [facility], [place]  ?  ? ?  ?  ?  ? ?

## 2021-11-26 ENCOUNTER — Encounter: Payer: Self-pay | Admitting: Internal Medicine

## 2021-11-26 ENCOUNTER — Ambulatory Visit (INDEPENDENT_AMBULATORY_CARE_PROVIDER_SITE_OTHER): Payer: Self-pay | Admitting: Internal Medicine

## 2021-11-26 VITALS — BP 102/60 | HR 75 | Ht 67.0 in | Wt 143.6 lb

## 2021-11-26 DIAGNOSIS — Z0000 Encounter for general adult medical examination without abnormal findings: Secondary | ICD-10-CM

## 2021-11-26 DIAGNOSIS — J439 Emphysema, unspecified: Secondary | ICD-10-CM

## 2021-11-26 DIAGNOSIS — M2241 Chondromalacia patellae, right knee: Secondary | ICD-10-CM | POA: Insufficient documentation

## 2021-11-26 DIAGNOSIS — R7303 Prediabetes: Secondary | ICD-10-CM

## 2021-11-26 DIAGNOSIS — Z1211 Encounter for screening for malignant neoplasm of colon: Secondary | ICD-10-CM

## 2021-11-26 DIAGNOSIS — I10 Essential (primary) hypertension: Secondary | ICD-10-CM

## 2021-11-26 DIAGNOSIS — E782 Mixed hyperlipidemia: Secondary | ICD-10-CM

## 2021-11-26 DIAGNOSIS — M2242 Chondromalacia patellae, left knee: Secondary | ICD-10-CM

## 2021-11-26 DIAGNOSIS — Z1231 Encounter for screening mammogram for malignant neoplasm of breast: Secondary | ICD-10-CM

## 2021-11-26 DIAGNOSIS — I25118 Atherosclerotic heart disease of native coronary artery with other forms of angina pectoris: Secondary | ICD-10-CM

## 2021-11-26 DIAGNOSIS — F411 Generalized anxiety disorder: Secondary | ICD-10-CM

## 2021-11-26 DIAGNOSIS — M222X1 Patellofemoral disorders, right knee: Secondary | ICD-10-CM | POA: Insufficient documentation

## 2021-11-26 LAB — POCT URINALYSIS DIPSTICK
Bilirubin, UA: NEGATIVE
Blood, UA: NEGATIVE
Glucose, UA: NEGATIVE
Ketones, UA: NEGATIVE
Leukocytes, UA: NEGATIVE
Nitrite, UA: NEGATIVE
Protein, UA: NEGATIVE
Spec Grav, UA: 1.015 (ref 1.010–1.025)
Urobilinogen, UA: 0.2 E.U./dL
pH, UA: 6 (ref 5.0–8.0)

## 2021-11-26 MED ORDER — ATORVASTATIN CALCIUM 80 MG PO TABS: 80.0000 mg | ORAL_TABLET | Freq: Every day | ORAL | 3 refills | Status: DC

## 2021-11-26 MED ORDER — CITALOPRAM HYDROBROMIDE 10 MG PO TABS: 10.0000 mg | ORAL_TABLET | Freq: Every day | ORAL | 3 refills | Status: DC

## 2021-11-26 NOTE — Progress Notes (Signed)
? ? ?Date:  11/26/2021  ? ?Name:  Teresa Gutierrez   DOB:  [birth year]   MRN:  [id number] ? ? ?Chief Complaint: Annual Exam ?Teresa Gutierrez is a 56 y.o. female who presents today for her Complete Annual Exam. She feels well. She reports exercising - not much ( knee pain ). She reports she is sleeping poorly. Breast complaints - none. ? ?Mammogram: 10/2020 ?DEXA: none ?Pap smear: discontinued ?Colonoscopy: FIT 09/2020 ? ?Health Maintenance Due  ?Topic Date Due  ? COLONOSCOPY (Pts 45-90yr Insurance coverage will need to be confirmed)  Never done  ? COLON CANCER SCREENING ANNUAL FOBT  10/16/2021  ? MAMMOGRAM  11/11/2021  ?  ?Immunization History  ?Administered Date(s) Administered  ? PFIZER(Purple Top)SARS-COV-2 Vaccination 12/04/2019, 12/25/2019, 07/30/2020, 03/12/2021  ? PNEUMOCOCCAL CONJUGATE-20 04/10/2021  ? Tdap 01/18/2019  ? Zoster Recombinat (Shingrix) 09/07/2019, 03/07/2020  ? ? ?Hypertension ?This is a chronic problem. The problem is controlled. Associated symptoms include anxiety. Pertinent negatives include no chest pain, headaches, palpitations or shortness of breath. Past treatments include angiotensin blockers and calcium channel blockers.  ?Hyperlipidemia ?This is a chronic problem. The problem is controlled. Pertinent negatives include no chest pain or shortness of breath. Current antihyperlipidemic treatment includes statins. The current treatment provides significant improvement of lipids.  ?Anxiety ?Presents for follow-up visit. Patient reports no chest pain, dizziness, nervous/anxious behavior, palpitations or shortness of breath. The quality of sleep is good.  ? ?Compliance with medications is 76-100% (Lexapro).  ?Diabetes ?She presents for her follow-up diabetic visit. Diabetes type: prediabetes. Pertinent negatives for hypoglycemia include no dizziness, headaches, nervousness/anxiousness or tremors. Pertinent negatives for diabetes include no chest pain, no fatigue, no polydipsia and no polyuria.   ?Knee Pain  ?There was no injury mechanism. The pain is present in the left knee and right knee. The pain is moderate. The pain has been Fluctuating since onset. The symptoms are aggravated by movement and weight bearing. She has tried NSAIDs (seen 2 yrs ago by [facility] dx with patellar chondromalacia) for the symptoms.  ? ?Lab Results  ?Component Value Date  ? NA 139 02/10/2021  ? K 4.2 02/10/2021  ? CO2 25 02/10/2021  ? GLUCOSE 97 02/10/2021  ? BUN 18 02/10/2021  ? CREATININE 0.64 02/10/2021  ? CALCIUM 10.0 02/10/2021  ? EGFR 105 02/10/2021  ? GFRNONAA >60 01/28/2021  ? ?Lab Results  ?Component Value Date  ? CHOL 140 04/10/2021  ? HDL 40 04/10/2021  ? L[place]idence68 04/10/2021  ? LDLDIRECT 64 04/10/2021  ? TRIG 188 (H) 04/10/2021  ? CHOLHDL 3.5 04/10/2021  ? ?Lab Results  ?Component Value Date  ? TSH 3.190 10/03/2020  ? ?Lab Results  ?Component Value Date  ? HGBA1C 5.5 04/10/2021  ? ?Lab Results  ?Component Value Date  ? WBC 10.9 (H) 02/10/2021  ? HGB 12.4 02/10/2021  ? HCT 36.4 02/10/2021  ? MCV 90 02/10/2021  ? PLT 318 02/10/2021  ? ?Lab Results  ?Component Value Date  ? ALT 29 01/25/2021  ? AST 35 01/25/2021  ? ALKPHOS 111 01/25/2021  ? BILITOT 1.0 01/25/2021  ? ?No results found for: 25OHVITD2, 2[place] VD25OH  ? ?Review of Systems  ?Constitutional:  Negative for chills, fatigue and fever.  ?HENT:  Negative for congestion, hearing loss, tinnitus, trouble swallowing and voice change.   ?Eyes:  Negative for visual disturbance.  ?Respiratory:  Negative for cough, chest tightness, shortness of breath and wheezing.   ?Cardiovascular:  Negative for chest pain, palpitations and leg swelling.  ?  Gastrointestinal:  Negative for abdominal pain, constipation, diarrhea and vomiting.  ?Endocrine: Negative for polydipsia and polyuria.  ?Genitourinary:  Negative for dysuria, frequency, genital sores, vaginal bleeding and vaginal discharge.  ?Musculoskeletal:  Positive for arthralgias and gait problem. Negative for joint  swelling.  ?Skin:  Negative for color change and rash.  ?Neurological:  Negative for dizziness, tremors, light-headedness and headaches.  ?Hematological:  Negative for adenopathy. Does not bruise/bleed easily.  ?Psychiatric/Behavioral:  Negative for dysphoric mood and sleep disturbance. The patient is not nervous/anxious.   ? ?Patient Active Problem List  ? Diagnosis Date Noted  ? Coronary artery disease of native artery of native heart with stable angina pectoris ([place]) 02/10/2021  ? Prediabetes 02/10/2021  ? S/P total hysterectomy 10/01/2020  ? Generalized anxiety disorder 09/01/2017  ? Eczema 04/20/2017  ? Back pain of lumbar region with sciatica 04/29/2016  ? Cervical disc disorder with radiculopathy 04/29/2016  ? Emphysema of lung ([place]) 12/09/2015  ? Solitary pulmonary nodule 12/09/2015  ? Hyperlipidemia 05/21/2015  ? Tobacco use disorder, severe, in early remission 05/21/2015  ? Muscle spasms of neck 05/21/2015  ? Gastroesophageal reflux disease without esophagitis 05/21/2015  ? Essential hypertension 05/21/2015  ? ? ?Allergies  ?Allergen Reactions  ? Other   ?  Bear meat  ? Wellbutrin [Bupropion]   ?  hostility  ? ? ?Past Surgical History:  ?Procedure Laterality Date  ? CORONARY STENT INTERVENTION N/A 01/27/2021  ? Procedure: CORONARY STENT INTERVENTION;  Surgeon: Isaias Cowman, MD;  Location: [place] [facility];  Service: Cardiovascular;  Laterality: N/A;  OM!  ? LEFT HEART CATH AND CORONARY ANGIOGRAPHY N/A 01/27/2021  ? Procedure: LEFT HEART CATH AND CORONARY ANGIOGRAPHY;  Surgeon: Isaias Cowman, MD;  Location: [place] [facility];  Service: Cardiovascular;  Laterality: N/A;  ? TOTAL ABDOMINAL HYSTERECTOMY  08/24/2012  ? ovaries remain  ? ? ?Social History  ? ?Tobacco Use  ? Smoking status: Former  ?  Packs/day: 0.25  ?  Years: 38.00  ?  Pack years: 9.50  ?  Types: Cigarettes  ?  Start date: 07/16/1982  ?  Quit date: 01/27/2021  ?  Years since quitting: 0.8  ? Smokeless tobacco: Never  ?Vaping  Use  ? Vaping Use: Never used  ?Substance Use Topics  ? Alcohol use: Yes  ?  Alcohol/week: 2.0 standard drinks  ?  Types: 2 Standard drinks or equivalent per week  ? Drug use: No  ? ? ? ?Medication list has been reviewed and updated. ? ?Current Meds  ?Medication Sig  ? aspirin 81 MG chewable tablet Chew 1 tablet (81 mg total) by mouth daily.  ? atorvastatin (LIPITOR) 80 MG tablet Take 1 tablet by mouth once daily  ? citalopram (CELEXA) 10 MG tablet Take 1 tablet by mouth once daily  ? irbesartan (AVAPRO) 300 MG tablet Take 0.5 tablets (150 mg total) by mouth daily.  ? metoprolol tartrate (LOPRESSOR) 25 MG tablet Take 1 tablet (25 mg total) by mouth 2 (two) times daily.  ? nitroGLYCERIN (NITROSTAT) 0.4 MG SL tablet Place 1 tablet (0.4 mg total) under the tongue every 5 (five) minutes as needed for chest pain.  ? ticagrelor (BRILINTA) 90 MG TABS tablet Take 1 tablet (90 mg total) by mouth 2 (two) times daily. (Patient taking differently: Take 90 mg by mouth daily.)  ? vitamin B-12 (CYANOCOBALAMIN) 500 MCG tablet Take 500 mcg by mouth daily.  ? ? ? ?  04/10/2021  ?  8:05 AM 02/10/2021  ?  2:42 PM 10/03/2020  ?  9:45 AM 03/07/2020  ?  8:41 AM  ?GAD 7 : Generalized Anxiety Score  ?Nervous, Anxious, on Edge 0 0 0 0  ?Control/stop worrying 0 0 0 0  ?Worry too much - different things 0 0 0 0  ?Trouble relaxing 0 0 0 0  ?Restless 0 0 0 0  ?Easily annoyed or irritable 0 0 0 0  ?Afraid - awful might happen 0 0 0 0  ?Total GAD 7 Score 0 0 0 0  ?Anxiety Difficulty   Not difficult at all Not difficult at all  ? ? ? ?  04/10/2021  ?  8:05 AM  ?Depression screen PHQ 2/9  ?Decreased Interest 0  ?Down, Depressed, Hopeless 0  ?PHQ - 2 Score 0  ?Altered sleeping 1  ?Tired, decreased energy 0  ?Change in appetite 0  ?Feeling bad or failure about yourself  0  ?Trouble concentrating 0  ?Moving slowly or fidgety/restless 0  ?Suicidal thoughts 0  ?PHQ-9 Score 1  ?Difficult doing work/chores Not difficult at all  ? ? ?BP Readings from Last 3  Encounters:  ?11/26/21 102/60  ?04/10/21 (!) 84/78  ?02/10/21 116/68  ? ? ?Physical Exam ?Vitals and nursing note reviewed.  ?Constitutional:   ?   General: She is not in acute distress. ?   Appearance: S

## 2021-11-28 LAB — COMPREHENSIVE METABOLIC PANEL
ALT: 25 IU/L (ref 0–32)
AST: 29 IU/L (ref 0–40)
Albumin/Globulin Ratio: 2.7 — ABNORMAL HIGH (ref 1.2–2.2)
Albumin: 5.3 g/dL — ABNORMAL HIGH (ref 3.8–4.9)
Alkaline Phosphatase: 147 IU/L — ABNORMAL HIGH (ref 44–121)
BUN/Creatinine Ratio: 23 (ref 9–23)
BUN: 16 mg/dL (ref 6–24)
Bilirubin Total: 0.6 mg/dL (ref 0.0–1.2)
CO2: 20 mmol/L (ref 20–29)
Calcium: 10.2 mg/dL (ref 8.7–10.2)
Chloride: 103 mmol/L (ref 96–106)
Creatinine, Ser: 0.69 mg/dL (ref 0.57–1.00)
Globulin, Total: 2 g/dL (ref 1.5–4.5)
Glucose: 97 mg/dL (ref 70–99)
Potassium: 4.4 mmol/L (ref 3.5–5.2)
Sodium: 143 mmol/L (ref 134–144)
Total Protein: 7.3 g/dL (ref 6.0–8.5)
eGFR: 102 mL/min/{1.73_m2} (ref >59)

## 2021-11-28 LAB — CBC WITH DIFFERENTIAL/PLATELET
Basophils Absolute: 0.1 10*3/uL (ref 0.0–0.2)
Basos: 1 %
EOS (ABSOLUTE): 0.2 10*3/uL (ref 0.0–0.4)
Eos: 2 %
Hematocrit: 42.7 % (ref 34.0–46.6)
Hemoglobin: 14.5 g/dL (ref 11.1–15.9)
Immature Grans (Abs): 0 10*3/uL (ref 0.0–0.1)
Immature Granulocytes: 0 %
Lymphocytes Absolute: 2 10*3/uL (ref 0.7–3.1)
Lymphs: 26 %
MCH: 31.3 pg (ref 26.6–33.0)
MCHC: 34 g/dL (ref 31.5–35.7)
MCV: 92 fL (ref 79–97)
Monocytes Absolute: 0.5 10*3/uL (ref 0.1–0.9)
Monocytes: 7 %
Neutrophils Absolute: 4.8 10*3/uL (ref 1.4–7.0)
Neutrophils: 64 %
Platelets: 230 10*3/uL (ref 150–450)
RBC: 4.64 x10E6/uL (ref 3.77–5.28)
RDW: 13.4 % (ref 11.7–15.4)
WBC: 7.6 10*3/uL (ref 3.4–10.8)

## 2021-11-28 LAB — LIPID PANEL
Chol/HDL Ratio: 2.7 ratio (ref 0.0–4.4)
Cholesterol, Total: 192 mg/dL (ref 100–199)
HDL: 70 mg/dL (ref >39)
LDL Chol Calc (NIH): 100 mg/dL — ABNORMAL HIGH (ref 0–99)
Triglycerides: 126 mg/dL (ref 0–149)
VLDL Cholesterol Cal: 22 mg/dL (ref 5–40)

## 2021-11-28 LAB — TSH: TSH: 2.87 u[IU]/mL (ref 0.450–4.500)

## 2021-11-28 LAB — HEMOGLOBIN A1C
Est. average glucose Bld gHb Est-mCnc: 108 mg/dL
Hgb A1c MFr Bld: 5.4 % (ref 4.8–5.6)

## 2021-12-18 ENCOUNTER — Telehealth: Payer: Self-pay

## 2021-12-18 NOTE — Telephone Encounter (Signed)
Called pt left VM to call back. ? ?Called as a reminder to call and schedule mammogram [phone] as well as complete and turn in FIT test. ? ?[place] nurse may give information to patient if they return call to clinic, a CRM has been created. ? ?KP ?

## 2021-12-24 LAB — FECAL OCCULT BLOOD, IMMUNOCHEMICAL: Fecal Occult Bld: NEGATIVE

## 2022-04-08 ENCOUNTER — Telehealth: Payer: Self-pay

## 2022-04-08 ENCOUNTER — Telehealth: Payer: Self-pay | Admitting: Internal Medicine

## 2022-04-08 ENCOUNTER — Other Ambulatory Visit: Payer: Self-pay

## 2022-04-08 DIAGNOSIS — I10 Essential (primary) hypertension: Secondary | ICD-10-CM

## 2022-04-08 MED ORDER — IRBESARTAN 300 MG PO TABS: 150.0000 mg | ORAL_TABLET | Freq: Every day | ORAL | 0 refills | Status: DC

## 2022-04-08 NOTE — Telephone Encounter (Signed)
Medication refill sent in. Pa completed waiting on insurance approval. Pt aware and verbalized understanding.  KP

## 2022-04-08 NOTE — Telephone Encounter (Signed)
PA completed waiting on insurance approval.  Key: [id number]  KP

## 2022-04-08 NOTE — Telephone Encounter (Signed)
Copied from CRM [id number]. Topic: General - Other >> Apr 08, 2022  9:27 AM Everette C wrote: Reason for CRM: The patient has called to request completion of a prior authorization for their irbesartan (AVAPRO) 300 MG tablet [[id number]]   Please contact the patient further if needed

## 2022-06-09 DIAGNOSIS — I25118 Atherosclerotic heart disease of native coronary artery with other forms of angina pectoris: Secondary | ICD-10-CM | POA: Diagnosis not present

## 2022-06-09 DIAGNOSIS — E782 Mixed hyperlipidemia: Secondary | ICD-10-CM | POA: Diagnosis not present

## 2022-06-09 DIAGNOSIS — I10 Essential (primary) hypertension: Secondary | ICD-10-CM | POA: Diagnosis not present

## 2022-06-09 DIAGNOSIS — J439 Emphysema, unspecified: Secondary | ICD-10-CM | POA: Diagnosis not present

## 2022-06-15 ENCOUNTER — Encounter: Payer: Self-pay | Admitting: Internal Medicine

## 2022-06-15 ENCOUNTER — Ambulatory Visit: Payer: BC Managed Care – PPO | Admitting: Internal Medicine

## 2022-06-15 VITALS — BP 124/78 | HR 74 | Ht 67.0 in | Wt 150.2 lb

## 2022-06-15 DIAGNOSIS — I10 Essential (primary) hypertension: Secondary | ICD-10-CM | POA: Diagnosis not present

## 2022-06-15 DIAGNOSIS — Z23 Encounter for immunization: Secondary | ICD-10-CM | POA: Diagnosis not present

## 2022-06-15 DIAGNOSIS — E782 Mixed hyperlipidemia: Secondary | ICD-10-CM | POA: Diagnosis not present

## 2022-06-15 DIAGNOSIS — I25118 Atherosclerotic heart disease of native coronary artery with other forms of angina pectoris: Secondary | ICD-10-CM | POA: Diagnosis not present

## 2022-06-15 NOTE — Patient Instructions (Signed)
Call [facility] to schedule your mammogram at [phone].  

## 2022-06-15 NOTE — Progress Notes (Signed)
Date:  06/15/2022   Name:  Teresa Gutierrez   DOB:  04-29-1966   MRN:  [id number]   Chief Complaint: Hypertension  Hypertension This is a chronic problem. The problem is controlled. Pertinent negatives include no chest pain, headaches, palpitations or shortness of breath. Past treatments include beta blockers and angiotensin blockers. The current treatment provides significant improvement. Hypertensive end-organ damage includes CAD/MI. There is no history of kidney disease or CVA.  Hyperlipidemia This is a chronic problem. The problem is controlled (LDL 100). Pertinent negatives include no chest pain, myalgias or shortness of breath. Current antihyperlipidemic treatment includes statins (Lipitor 80).  CAD - doing well, Brilinta discontinued.  Smoking very little - and trying a new device called a FUM which is not a vape.  Lab Results  Component Value Date   NA 143 11/26/2021   K 4.4 11/26/2021   CO2 20 11/26/2021   GLUCOSE 97 11/26/2021   BUN 16 11/26/2021   CREATININE 0.69 11/26/2021   CALCIUM 10.2 11/26/2021   EGFR 102 11/26/2021   GFRNONAA >60 01/28/2021   Lab Results  Component Value Date   CHOL 192 11/26/2021   HDL 70 11/26/2021   LDLCALC 100 (H) 11/26/2021   LDLDIRECT 64 04/10/2021   TRIG 126 11/26/2021   CHOLHDL 2.7 11/26/2021   Lab Results  Component Value Date   TSH 2.870 11/26/2021   Lab Results  Component Value Date   HGBA1C 5.4 11/26/2021   Lab Results  Component Value Date   WBC 7.6 11/26/2021   HGB 14.5 11/26/2021   HCT 42.7 11/26/2021   MCV 92 11/26/2021   PLT 230 11/26/2021   Lab Results  Component Value Date   ALT 25 11/26/2021   AST 29 11/26/2021   ALKPHOS 147 (H) 11/26/2021   BILITOT 0.6 11/26/2021   No results found for: "25OHVITD2", "25OHVITD3", "VD25OH"   Review of Systems  Constitutional:  Negative for fatigue and unexpected weight change.  HENT:  Negative for nosebleeds.   Eyes:  Negative for visual disturbance.   Respiratory:  Negative for cough, chest tightness, shortness of breath and wheezing.   Cardiovascular:  Negative for chest pain, palpitations and leg swelling.  Gastrointestinal:  Negative for abdominal pain, constipation and diarrhea.  Musculoskeletal:  Negative for arthralgias, joint swelling and myalgias.  Neurological:  Negative for dizziness, weakness, light-headedness and headaches.  Psychiatric/Behavioral:  Negative for dysphoric mood and sleep disturbance. The patient is not nervous/anxious.     Patient Active Problem List   Diagnosis Date Noted   Chondromalacia of both patellae 11/26/2021   Coronary artery disease of native artery of native heart with stable angina pectoris ([place]) 02/10/2021   Prediabetes 02/10/2021   S/P total hysterectomy 10/01/2020   Generalized anxiety disorder 09/01/2017   Eczema 04/20/2017   Back pain of lumbar region with sciatica 04/29/2016   Cervical disc disorder with radiculopathy 04/29/2016   Emphysema of lung ([place]) 12/09/2015   Solitary pulmonary nodule 12/09/2015   Hyperlipidemia 05/21/2015   Tobacco use disorder, severe, in early remission 05/21/2015   Muscle spasms of neck 05/21/2015   Gastroesophageal reflux disease without esophagitis 05/21/2015   Essential hypertension 05/21/2015    Allergies  Allergen Reactions   Other     Bear meat   Wellbutrin [Bupropion]     hostility    Past Surgical History:  Procedure Laterality Date   CORONARY STENT INTERVENTION N/A 01/27/2021   Procedure: CORONARY STENT INTERVENTION;  Surgeon: Isaias Cowman, MD;  Location: [place]  [facility];  Service: Cardiovascular;  Laterality: N/A;  OM!   LEFT HEART CATH AND CORONARY ANGIOGRAPHY N/A 01/27/2021   Procedure: LEFT HEART CATH AND CORONARY ANGIOGRAPHY;  Surgeon: Isaias Cowman, MD;  Location: [place] [facility];  Service: Cardiovascular;  Laterality: N/A;   TOTAL ABDOMINAL HYSTERECTOMY  08/24/2012   ovaries remain    Social History    Tobacco Use   Smoking status: Former    Packs/day: 0.25    Years: 38.00    Total pack years: 9.50    Types: Cigarettes    Start date: 07/16/1982    Quit date: 01/27/2021    Years since quitting: 1.3   Smokeless tobacco: Never  Vaping Use   Vaping Use: Never used  Substance Use Topics   Alcohol use: Yes    Alcohol/week: 2.0 standard drinks of alcohol    Types: 2 Standard drinks or equivalent per week   Drug use: No     Medication list has been reviewed and updated.  Current Meds  Medication Sig   aspirin 81 MG chewable tablet Chew 1 tablet (81 mg total) by mouth daily.   atorvastatin (LIPITOR) 80 MG tablet Take 1 tablet (80 mg total) by mouth daily.   citalopram (CELEXA) 10 MG tablet Take 1 tablet (10 mg total) by mouth daily.   irbesartan (AVAPRO) 300 MG tablet Take 0.5 tablets (150 mg total) by mouth daily.   metoprolol tartrate (LOPRESSOR) 25 MG tablet Take 1 tablet (25 mg total) by mouth 2 (two) times daily.   nitroGLYCERIN (NITROSTAT) 0.4 MG SL tablet Place 1 tablet (0.4 mg total) under the tongue every 5 (five) minutes as needed for chest pain.   vitamin B-12 (CYANOCOBALAMIN) 500 MCG tablet Take 500 mcg by mouth daily.       11/26/2021   10:36 AM 04/10/2021    8:05 AM 02/10/2021    2:42 PM 10/03/2020    9:45 AM  GAD 7 : Generalized Anxiety Score  Nervous, Anxious, on Edge 1 0 0 0  Control/stop worrying 0 0 0 0  Worry too much - different things 0 0 0 0  Trouble relaxing 1 0 0 0  Restless 1 0 0 0  Easily annoyed or irritable 1 0 0 0  Afraid - awful might happen 0 0 0 0  Total GAD 7 Score 4 0 0 0  Anxiety Difficulty Not difficult at all   Not difficult at all       11/26/2021   10:36 AM 04/10/2021    8:05 AM 02/10/2021    2:42 PM  Depression screen PHQ 2/9  Decreased Interest 0 0 0  Down, Depressed, Hopeless 0 0 0  PHQ - 2 Score 0 0 0  Altered sleeping '3 1 2  ' Tired, decreased energy 1 0 1  Change in appetite 0 0 0  Feeling bad or failure about yourself  0 0  0  Trouble concentrating 0 0 0  Moving slowly or fidgety/restless 0 0 0  Suicidal thoughts 0 0 0  PHQ-9 Score '4 1 3  ' Difficult doing work/chores Not difficult at all Not difficult at all Not difficult at all    BP Readings from Last 3 Encounters:  06/15/22 134/78  11/26/21 102/60  04/10/21 (!) 84/78    Physical Exam Vitals and nursing note reviewed.  Constitutional:      General: She is not in acute distress.    Appearance: She is well-developed.  HENT:     Head: Normocephalic  and atraumatic.  Neck:     Vascular: No carotid bruit.  Cardiovascular:     Rate and Rhythm: Normal rate and regular rhythm.     Pulses: Normal pulses.  Pulmonary:     Effort: Pulmonary effort is normal. No respiratory distress.     Breath sounds: No wheezing or rhonchi.  Musculoskeletal:     Cervical back: Normal range of motion.     Right lower leg: No edema.     Left lower leg: No edema.  Lymphadenopathy:     Cervical: No cervical adenopathy.  Skin:    General: Skin is warm and dry.     Capillary Refill: Capillary refill takes less than 2 seconds.     Findings: No rash.  Neurological:     Mental Status: She is alert and oriented to person, place, and time.  Psychiatric:        Mood and Affect: Mood normal.        Behavior: Behavior normal.     Wt Readings from Last 3 Encounters:  06/15/22 150 lb 3.2 oz (68.1 kg)  11/26/21 143 lb 9.6 oz (65.1 kg)  04/10/21 158 lb (71.7 kg)    BP 134/78 (BP Location: Left Arm, Patient Position: Sitting, Cuff Size: Normal)   Pulse 74   Ht '5\' 7"'  (1.702 m)   Wt 150 lb 3.2 oz (68.1 kg)   SpO2 96%   BMI 23.52 kg/m   Assessment and Plan: 1. Essential hypertension Clinically stable exam with well controlled BP. Tolerating medications without side effects at this time. Pt to continue current regimen and low sodium diet; benefits of regular exercise as able discussed. - Comprehensive metabolic panel  2. Coronary artery disease of native artery of  native heart with stable angina pectoris [facility]) Doing well; recent Cardiology visit Continues to work on quitting tobacco completely  3. Mixed hyperlipidemia LDL goal <70 May need to change Lipitor 80 to Crestor 40 - Comprehensive metabolic panel - Lipid panel   Partially dictated using Editor, commissioning. Any errors are unintentional.  Halina Maidens, MD [facility]  06/15/2022

## 2022-06-16 LAB — LIPID PANEL
Chol/HDL Ratio: 2.4 ratio (ref 0.0–4.4)
Cholesterol, Total: 174 mg/dL (ref 100–199)
HDL: 73 mg/dL (ref >39)
LDL Chol Calc (NIH): 79 mg/dL (ref 0–99)
Triglycerides: 126 mg/dL (ref 0–149)
VLDL Cholesterol Cal: 22 mg/dL (ref 5–40)

## 2022-06-16 LAB — COMPREHENSIVE METABOLIC PANEL
ALT: 27 IU/L (ref 0–32)
AST: 26 IU/L (ref 0–40)
Albumin/Globulin Ratio: 2.7 — ABNORMAL HIGH (ref 1.2–2.2)
Albumin: 4.8 g/dL (ref 3.8–4.9)
Alkaline Phosphatase: 145 IU/L — ABNORMAL HIGH (ref 44–121)
BUN/Creatinine Ratio: 19 (ref 9–23)
BUN: 14 mg/dL (ref 6–24)
Bilirubin Total: 0.4 mg/dL (ref 0.0–1.2)
CO2: 19 mmol/L — ABNORMAL LOW (ref 20–29)
Calcium: 9.8 mg/dL (ref 8.7–10.2)
Chloride: 103 mmol/L (ref 96–106)
Creatinine, Ser: 0.74 mg/dL (ref 0.57–1.00)
Globulin, Total: 1.8 g/dL (ref 1.5–4.5)
Glucose: 103 mg/dL — ABNORMAL HIGH (ref 70–99)
Potassium: 4.7 mmol/L (ref 3.5–5.2)
Sodium: 143 mmol/L (ref 134–144)
Total Protein: 6.6 g/dL (ref 6.0–8.5)
eGFR: 95 mL/min/{1.73_m2} (ref >59)

## 2022-06-30 ENCOUNTER — Other Ambulatory Visit: Payer: Self-pay | Admitting: Internal Medicine

## 2022-06-30 DIAGNOSIS — I10 Essential (primary) hypertension: Secondary | ICD-10-CM

## 2022-06-30 NOTE — Telephone Encounter (Signed)
Requested Prescriptions  Pending Prescriptions Disp Refills   irbesartan (AVAPRO) 300 MG tablet [Pharmacy Med Name: Irbesartan 300 MG Oral Tablet] 45 tablet 2    Sig: Take 1/2 (one-half) tablet by mouth once daily     Cardiovascular:  Angiotensin Receptor Blockers Passed - 06/30/2022  1:57 PM      Passed - Cr in normal range and within 180 days    Creatinine, Ser  Date Value Ref Range Status  06/15/2022 0.74 0.57 - 1.00 mg/dL Final         Passed - K in normal range and within 180 days    Potassium  Date Value Ref Range Status  06/15/2022 4.7 3.5 - 5.2 mmol/L Final         Passed - Patient is not pregnant      Passed - Last BP in normal range    BP Readings from Last 1 Encounters:  06/15/22 124/78         Passed - Valid encounter within last 6 months    Recent Outpatient Visits           2 weeks ago Essential hypertension   [place] [facility], Jesse Sans, MD   7 months ago Annual physical exam   [place] [facility], Jesse Sans, MD   1 year ago Essential hypertension   [place] [facility] at [facility], Jesse Sans, MD   1 year ago Coronary artery disease of native artery of native heart with stable angina pectoris [facility])   [place] [facility] at [facility], Jesse Sans, MD   1 year ago Annual physical exam   [facility] at [facility], Jesse Sans, MD       Future Appointments             In 5 months Army Melia, Jesse Sans, MD [place] [facility] at [facility]

## 2022-11-04 ENCOUNTER — Other Ambulatory Visit: Payer: Self-pay | Admitting: Internal Medicine

## 2022-11-04 DIAGNOSIS — F411 Generalized anxiety disorder: Secondary | ICD-10-CM

## 2022-12-01 ENCOUNTER — Encounter: Payer: Self-pay | Admitting: Internal Medicine

## 2022-12-01 ENCOUNTER — Ambulatory Visit (INDEPENDENT_AMBULATORY_CARE_PROVIDER_SITE_OTHER): Payer: BC Managed Care – PPO | Admitting: Internal Medicine

## 2022-12-01 VITALS — BP 124/72 | HR 73 | Ht 67.0 in | Wt 161.0 lb

## 2022-12-01 DIAGNOSIS — Z1211 Encounter for screening for malignant neoplasm of colon: Secondary | ICD-10-CM

## 2022-12-01 DIAGNOSIS — Z0000 Encounter for general adult medical examination without abnormal findings: Secondary | ICD-10-CM | POA: Diagnosis not present

## 2022-12-01 DIAGNOSIS — Z1231 Encounter for screening mammogram for malignant neoplasm of breast: Secondary | ICD-10-CM

## 2022-12-01 DIAGNOSIS — R7303 Prediabetes: Secondary | ICD-10-CM

## 2022-12-01 DIAGNOSIS — E782 Mixed hyperlipidemia: Secondary | ICD-10-CM | POA: Diagnosis not present

## 2022-12-01 DIAGNOSIS — I10 Essential (primary) hypertension: Secondary | ICD-10-CM | POA: Diagnosis not present

## 2022-12-01 DIAGNOSIS — J439 Emphysema, unspecified: Secondary | ICD-10-CM

## 2022-12-01 DIAGNOSIS — F411 Generalized anxiety disorder: Secondary | ICD-10-CM

## 2022-12-01 DIAGNOSIS — I25118 Atherosclerotic heart disease of native coronary artery with other forms of angina pectoris: Secondary | ICD-10-CM

## 2022-12-01 MED ORDER — ATORVASTATIN CALCIUM 80 MG PO TABS: 80.0000 mg | ORAL_TABLET | Freq: Every day | ORAL | 3 refills | Status: DC

## 2022-12-01 NOTE — Patient Instructions (Signed)
Call [facility] to schedule your mammogram at [phone].  

## 2022-12-01 NOTE — Assessment & Plan Note (Signed)
Clinically stable exam with well controlled BP on irbesartan and metoprolol. Tolerating medications without side effects. Pt to continue current regimen and low sodium diet.

## 2022-12-01 NOTE — Progress Notes (Signed)
Date:  12/01/2022   Name:  Teresa Gutierrez   DOB:  04/10/1966   MRN:  [id number]   Chief Complaint: Annual Exam Teresa Gutierrez is a 57 y.o. female who presents today for her Complete Annual Exam. She feels well. She reports exercising - none. She reports she is sleeping poorly. Breast complaints - none.  Mammogram: 10/2020 DEXA: none Pap smear: discontinued Colonoscopy: FIT 11/2021  Health Maintenance Due  Topic Date Due   COLONOSCOPY (Pts 45-23yrs Insurance coverage will need to be confirmed)  Never done   MAMMOGRAM  11/11/2021    Immunization History  Administered Date(s) Administered   COVID-19, mRNA, vaccine(Comirnaty)12 years and older 06/15/2022   PFIZER(Purple Top)SARS-COV-2 Vaccination 12/04/2019, 12/25/2019, 07/30/2020, 03/12/2021   PNEUMOCOCCAL CONJUGATE-20 04/10/2021   Tdap 01/18/2019   Zoster Recombinat (Shingrix) 09/07/2019, 03/07/2020    Hypertension This is a chronic problem. The problem is controlled. Associated symptoms include anxiety. Pertinent negatives include no chest pain, headaches, palpitations or shortness of breath.  Hyperlipidemia This is a chronic problem. The problem is controlled. Pertinent negatives include no chest pain or shortness of breath. Current antihyperlipidemic treatment includes statins.  Anxiety Presents for follow-up visit. Patient reports no chest pain, dizziness, nervous/anxious behavior, palpitations or shortness of breath. Symptoms occur occasionally.   Compliance with medications is 76-100%.    Lab Results  Component Value Date   NA 143 06/15/2022   K 4.7 06/15/2022   CO2 19 (L) 06/15/2022   GLUCOSE 103 (H) 06/15/2022   BUN 14 06/15/2022   CREATININE 0.74 06/15/2022   CALCIUM 9.8 06/15/2022   EGFR 95 06/15/2022   GFRNONAA >60 01/28/2021   Lab Results  Component Value Date   CHOL 174 06/15/2022   HDL 73 06/15/2022   LDLCALC 79 06/15/2022   LDLDIRECT 64 04/10/2021   TRIG 126 06/15/2022   CHOLHDL 2.4  06/15/2022   Lab Results  Component Value Date   TSH 2.870 11/26/2021   Lab Results  Component Value Date   HGBA1C 5.4 11/26/2021   Lab Results  Component Value Date   WBC 7.6 11/26/2021   HGB 14.5 11/26/2021   HCT 42.7 11/26/2021   MCV 92 11/26/2021   PLT 230 11/26/2021   Lab Results  Component Value Date   ALT 27 06/15/2022   AST 26 06/15/2022   ALKPHOS 145 (H) 06/15/2022   BILITOT 0.4 06/15/2022   No results found for: "25OHVITD2", "25OHVITD3", "VD25OH"   Review of Systems  Constitutional:  Negative for chills, fatigue and fever.  HENT:  Negative for congestion, hearing loss, tinnitus, trouble swallowing and voice change.   Eyes:  Negative for visual disturbance.  Respiratory:  Negative for cough, chest tightness, shortness of breath and wheezing.   Cardiovascular:  Negative for chest pain, palpitations and leg swelling.  Gastrointestinal:  Negative for abdominal pain, constipation, diarrhea and vomiting.  Endocrine: Negative for polydipsia and polyuria.  Genitourinary:  Negative for dysuria, frequency, genital sores, vaginal bleeding and vaginal discharge.  Musculoskeletal:  Negative for arthralgias, gait problem and joint swelling.  Skin:  Negative for color change and rash.  Neurological:  Negative for dizziness, tremors, light-headedness and headaches.  Hematological:  Negative for adenopathy. Does not bruise/bleed easily.  Psychiatric/Behavioral:  Negative for dysphoric mood and sleep disturbance. The patient is not nervous/anxious.     Patient Active Problem List   Diagnosis Date Noted   Chondromalacia of both patellae 11/26/2021   Coronary artery disease of native artery of native heart with stable angina  pectoris 02/10/2021   Prediabetes 02/10/2021   S/P total hysterectomy 10/01/2020   Generalized anxiety disorder 09/01/2017   Eczema 04/20/2017   Back pain of lumbar region with sciatica 04/29/2016   Cervical disc disorder with radiculopathy 04/29/2016    Emphysema of lung 12/09/2015   Solitary pulmonary nodule 12/09/2015   Hyperlipidemia 05/21/2015   Tobacco use disorder, severe, in early remission 05/21/2015   Muscle spasms of neck 05/21/2015   Gastroesophageal reflux disease without esophagitis 05/21/2015   Essential hypertension 05/21/2015    Allergies  Allergen Reactions   Other     Bear meat   Wellbutrin [Bupropion]     hostility    Past Surgical History:  Procedure Laterality Date   CORONARY STENT INTERVENTION N/A 01/27/2021   Procedure: CORONARY STENT INTERVENTION;  Surgeon: Marcina Millard, MD;  Location: [facility] INVASIVE CV LAB;  Service: Cardiovascular;  Laterality: N/A;  OM!   LEFT HEART CATH AND CORONARY ANGIOGRAPHY N/A 01/27/2021   Procedure: LEFT HEART CATH AND CORONARY ANGIOGRAPHY;  Surgeon: Marcina Millard, MD;  Location: [facility] INVASIVE CV LAB;  Service: Cardiovascular;  Laterality: N/A;   TOTAL ABDOMINAL HYSTERECTOMY  08/24/2012   ovaries remain    Social History   Tobacco Use   Smoking status: Former    Packs/day: 0.25    Years: 38.00    Additional pack years: 0.00    Total pack years: 9.50    Types: Cigarettes    Start date: 07/16/1982    Quit date: 01/27/2021    Years since quitting: 1.8   Smokeless tobacco: Never  Vaping Use   Vaping Use: Never used  Substance Use Topics   Alcohol use: Yes    Alcohol/week: 2.0 standard drinks of alcohol    Types: 2 Standard drinks or equivalent per week   Drug use: No     Medication list has been reviewed and updated.  Current Meds  Medication Sig   aspirin 81 MG chewable tablet Chew 1 tablet (81 mg total) by mouth daily.   citalopram (CELEXA) 10 MG tablet Take 1 tablet by mouth once daily   irbesartan (AVAPRO) 300 MG tablet Take 1/2 (one-half) tablet by mouth once daily   metoprolol tartrate (LOPRESSOR) 25 MG tablet Take 1 tablet (25 mg total) by mouth 2 (two) times daily.   nitroGLYCERIN (NITROSTAT) 0.4 MG SL tablet Place 1 tablet (0.4 mg total)  under the tongue every 5 (five) minutes as needed for chest pain.   vitamin B-12 (CYANOCOBALAMIN) 500 MCG tablet Take 500 mcg by mouth daily.   [DISCONTINUED] atorvastatin (LIPITOR) 80 MG tablet Take 1 tablet (80 mg total) by mouth daily.       06/15/2022    8:31 AM 11/26/2021   10:36 AM 04/10/2021    8:05 AM 02/10/2021    2:42 PM  GAD 7 : Generalized Anxiety Score  Nervous, Anxious, on Edge 0 1 0 0  Control/stop worrying 0 0 0 0  Worry too much - different things 0 0 0 0  Trouble relaxing 1 1 0 0  Restless 1 1 0 0  Easily annoyed or irritable 0 1 0 0  Afraid - awful might happen 0 0 0 0  Total GAD 7 Score 2 4 0 0  Anxiety Difficulty Not difficult at all Not difficult at all         06/15/2022    8:31 AM 11/26/2021   10:36 AM 04/10/2021    8:05 AM  Depression screen PHQ 2/9  Decreased Interest 0  0 0  Down, Depressed, Hopeless 0 0 0  PHQ - 2 Score 0 0 0  Altered sleeping 3 3 1   Tired, decreased energy 1 1 0  Change in appetite 0 0 0  Feeling bad or failure about yourself  0 0 0  Trouble concentrating 0 0 0  Moving slowly or fidgety/restless 0 0 0  Suicidal thoughts 0 0 0  PHQ-9 Score 4 4 1   Difficult doing work/chores Somewhat difficult Not difficult at all Not difficult at all    BP Readings from Last 3 Encounters:  12/01/22 124/72  06/15/22 124/78  11/26/21 102/60    Physical Exam Vitals and nursing note reviewed.  Constitutional:      General: She is not in acute distress.    Appearance: She is well-developed.  HENT:     Head: Normocephalic and atraumatic.     Right Ear: Tympanic membrane and ear canal normal.     Left Ear: Tympanic membrane and ear canal normal.     Nose:     Right Sinus: No maxillary sinus tenderness.     Left Sinus: No maxillary sinus tenderness.  Eyes:     General: No scleral icterus.       Right eye: No discharge.        Left eye: No discharge.     Conjunctiva/sclera: Conjunctivae normal.  Neck:     Thyroid: No thyromegaly.      Vascular: No carotid bruit.  Cardiovascular:     Rate and Rhythm: Normal rate and regular rhythm.     Pulses: Normal pulses.     Heart sounds: Normal heart sounds.  Pulmonary:     Effort: Pulmonary effort is normal. No respiratory distress.     Breath sounds: No wheezing.  Chest:  Breasts:    Right: No mass, nipple discharge, skin change or tenderness.     Left: No mass, nipple discharge, skin change or tenderness.  Abdominal:     General: Bowel sounds are normal.     Palpations: Abdomen is soft.     Tenderness: There is no abdominal tenderness.  Musculoskeletal:     Cervical back: Normal range of motion. No erythema.     Right lower leg: No edema.     Left lower leg: No edema.  Lymphadenopathy:     Cervical: No cervical adenopathy.  Skin:    General: Skin is warm and dry.     Capillary Refill: Capillary refill takes less than 2 seconds.     Findings: No rash.  Neurological:     General: No focal deficit present.     Mental Status: She is alert and oriented to person, place, and time.     Cranial Nerves: No cranial nerve deficit.     Sensory: No sensory deficit.     Deep Tendon Reflexes: Reflexes are normal and symmetric.  Psychiatric:        Attention and Perception: Attention normal.        Mood and Affect: Mood normal.     Wt Readings from Last 3 Encounters:  12/01/22 161 lb (73 kg)  06/15/22 150 lb 3.2 oz (68.1 kg)  11/26/21 143 lb 9.6 oz (65.1 kg)    BP 124/72   Pulse 73   Ht 5\' 7"  (1.702 m)   Wt 161 lb (73 kg)   SpO2 97%   BMI 25.22 kg/m   Assessment and Plan:  Problem List Items Addressed This Visit  Cardiovascular and Mediastinum   Coronary artery disease of native artery of native heart with stable angina pectoris (Chronic)    S/p stent in 2022 On statin and aspirin      Relevant Medications   atorvastatin (LIPITOR) 80 MG tablet   Essential hypertension (Chronic)    Clinically stable exam with well controlled BP on irbesartan and  metoprolol. Tolerating medications without side effects. Pt to continue current regimen and low sodium diet.       Relevant Medications   atorvastatin (LIPITOR) 80 MG tablet   Other Relevant Orders   CBC with Differential/Platelet   Comprehensive metabolic panel   TSH   Urinalysis, Routine w reflex microscopic     Respiratory   Emphysema of lung     Other   Generalized anxiety disorder (Chronic)    Clinically stable on current regimen with good control of symptoms, No SI or HI. No change in management at this time. Celexa 10 mg.      Hyperlipidemia (Chronic)    Tolerating statin medications without concerns LDL is  Lab Results  Component Value Date   LDLCALC 79 06/15/2022  with a goal of < 70. Current dose will be adjusted if needed.       Relevant Medications   atorvastatin (LIPITOR) 80 MG tablet   Other Relevant Orders   Comprehensive metabolic panel   Lipid panel   Prediabetes (Chronic)   Relevant Orders   Hemoglobin A1c   Other Visit Diagnoses     Annual physical exam    -  Primary   Relevant Orders   CBC with Differential/Platelet   Comprehensive metabolic panel   Hemoglobin A1c   Lipid panel   TSH   Ambulatory referral to Dermatology   Encounter for screening mammogram for breast cancer       Relevant Orders   MM 3D SCREENING MAMMOGRAM BILATERAL BREAST   Colon cancer screening       Relevant Orders   Fecal occult blood, imunochemical       Return in about 1 year (around 12/01/2023) for CPX.   Partially dictated using Dragon software, any errors are not intentional.  Reubin MilanLaura H. Remingtyn Depaola, MD [facility] [place], [place]

## 2022-12-01 NOTE — Assessment & Plan Note (Signed)
Tolerating statin medications without concerns LDL is  Lab Results  Component Value Date   LDLCALC 79 06/15/2022   with a goal of < 70. Current dose will be adjusted if needed.

## 2022-12-01 NOTE — Assessment & Plan Note (Signed)
Clinically stable on current regimen with good control of symptoms, No SI or HI. No change in management at this time. Celexa 10 mg.

## 2022-12-01 NOTE — Assessment & Plan Note (Signed)
S/p stent in 2022 On statin and aspirin

## 2022-12-02 ENCOUNTER — Other Ambulatory Visit: Payer: Self-pay

## 2022-12-02 DIAGNOSIS — E782 Mixed hyperlipidemia: Secondary | ICD-10-CM

## 2022-12-02 LAB — COMPREHENSIVE METABOLIC PANEL
ALT: 31 IU/L (ref 0–32)
AST: 23 IU/L (ref 0–40)
Albumin/Globulin Ratio: 2.4 — ABNORMAL HIGH (ref 1.2–2.2)
Albumin: 4.7 g/dL (ref 3.8–4.9)
Alkaline Phosphatase: 137 IU/L — ABNORMAL HIGH (ref 44–121)
BUN/Creatinine Ratio: 13 (ref 9–23)
BUN: 10 mg/dL (ref 6–24)
Bilirubin Total: 0.5 mg/dL (ref 0.0–1.2)
CO2: 21 mmol/L (ref 20–29)
Calcium: 10 mg/dL (ref 8.7–10.2)
Chloride: 103 mmol/L (ref 96–106)
Creatinine, Ser: 0.77 mg/dL (ref 0.57–1.00)
Globulin, Total: 2 g/dL (ref 1.5–4.5)
Glucose: 98 mg/dL (ref 70–99)
Potassium: 4.6 mmol/L (ref 3.5–5.2)
Sodium: 139 mmol/L (ref 134–144)
Total Protein: 6.7 g/dL (ref 6.0–8.5)
eGFR: 90 mL/min/{1.73_m2} (ref >59)

## 2022-12-02 LAB — TSH: TSH: 3.07 u[IU]/mL (ref 0.450–4.500)

## 2022-12-02 LAB — URINALYSIS, ROUTINE W REFLEX MICROSCOPIC
Bilirubin, UA: NEGATIVE
Glucose, UA: NEGATIVE
Ketones, UA: NEGATIVE
Nitrite, UA: NEGATIVE
Protein,UA: NEGATIVE
RBC, UA: NEGATIVE
Specific Gravity, UA: 1.007 (ref 1.005–1.030)
Urobilinogen, Ur: 0.2 mg/dL (ref 0.2–1.0)
pH, UA: 6 (ref 5.0–7.5)

## 2022-12-02 LAB — MICROSCOPIC EXAMINATION
Bacteria, UA: NONE SEEN
Casts: NONE SEEN /lpf
RBC, Urine: NONE SEEN /hpf (ref 0–2)

## 2022-12-02 LAB — CBC WITH DIFFERENTIAL/PLATELET
Basophils Absolute: 0.1 10*3/uL (ref 0.0–0.2)
Basos: 1 %
EOS (ABSOLUTE): 0.1 10*3/uL (ref 0.0–0.4)
Eos: 2 %
Hematocrit: 42.6 % (ref 34.0–46.6)
Hemoglobin: 15.1 g/dL (ref 11.1–15.9)
Immature Grans (Abs): 0 10*3/uL (ref 0.0–0.1)
Immature Granulocytes: 0 %
Lymphocytes Absolute: 1.8 10*3/uL (ref 0.7–3.1)
Lymphs: 22 %
MCH: 32.5 pg (ref 26.6–33.0)
MCHC: 35.4 g/dL (ref 31.5–35.7)
MCV: 92 fL (ref 79–97)
Monocytes Absolute: 0.6 10*3/uL (ref 0.1–0.9)
Monocytes: 7 %
Neutrophils Absolute: 5.4 10*3/uL (ref 1.4–7.0)
Neutrophils: 68 %
Platelets: 221 10*3/uL (ref 150–450)
RBC: 4.64 x10E6/uL (ref 3.77–5.28)
RDW: 11.8 % (ref 11.7–15.4)
WBC: 8 10*3/uL (ref 3.4–10.8)

## 2022-12-02 LAB — LIPID PANEL
Chol/HDL Ratio: 2.6 ratio (ref 0.0–4.4)
Cholesterol, Total: 164 mg/dL (ref 100–199)
HDL: 62 mg/dL (ref >39)
LDL Chol Calc (NIH): 73 mg/dL (ref 0–99)
Triglycerides: 175 mg/dL — ABNORMAL HIGH (ref 0–149)
VLDL Cholesterol Cal: 29 mg/dL (ref 5–40)

## 2022-12-02 LAB — HEMOGLOBIN A1C
Est. average glucose Bld gHb Est-mCnc: 120 mg/dL
Hgb A1c MFr Bld: 5.8 % — ABNORMAL HIGH (ref 4.8–5.6)

## 2022-12-02 MED ORDER — ROSUVASTATIN CALCIUM 40 MG PO TABS
40.0000 mg | ORAL_TABLET | Freq: Every day | ORAL | 3 refills | Status: DC
Start: 2022-12-02 — End: 2023-12-02

## 2022-12-21 DIAGNOSIS — Z1211 Encounter for screening for malignant neoplasm of colon: Secondary | ICD-10-CM | POA: Diagnosis not present

## 2022-12-24 LAB — FECAL OCCULT BLOOD, IMMUNOCHEMICAL: Fecal Occult Bld: NEGATIVE

## 2023-02-01 ENCOUNTER — Other Ambulatory Visit: Payer: Self-pay | Admitting: Internal Medicine

## 2023-02-01 DIAGNOSIS — F411 Generalized anxiety disorder: Secondary | ICD-10-CM

## 2023-02-02 NOTE — Telephone Encounter (Signed)
Requested Prescriptions  Pending Prescriptions Disp Refills   citalopram (CELEXA) 10 MG tablet [Pharmacy Med Name: Citalopram Hydrobromide 10 MG Oral Tablet] 90 tablet 1    Sig: Take 1 tablet by mouth once daily     Psychiatry:  Antidepressants - SSRI Passed - 02/01/2023  6:53 AM      Passed - Valid encounter within last 6 months    Recent Outpatient Visits           2 months ago Annual physical exam   [place] [facility] at [facility], Nyoka Cowden, MD   7 months ago Essential hypertension   [place] [facility] at [facility], Nyoka Cowden, MD   1 year ago Annual physical exam   [facility] at [facility], Nyoka Cowden, MD   1 year ago Essential hypertension   [place] [facility] at [facility], Nyoka Cowden, MD   1 year ago Coronary artery disease of native artery of native heart with stable angina pectoris [facility])    Primary Care & Sports Medicine at [facility], Nyoka Cowden, MD       Future Appointments             In 1 month Judithann Graves, Nyoka Cowden, MD [facility] at [facility], [facility]   In 10 months Judithann Graves, Nyoka Cowden, MD [facility] Medicine at [facility], [facility]

## 2023-03-25 ENCOUNTER — Other Ambulatory Visit: Payer: Self-pay | Admitting: Internal Medicine

## 2023-03-25 DIAGNOSIS — I10 Essential (primary) hypertension: Secondary | ICD-10-CM

## 2023-03-25 NOTE — Telephone Encounter (Signed)
Requested Prescriptions  Pending Prescriptions Disp Refills   irbesartan (AVAPRO) 300 MG tablet [Pharmacy Med Name: Irbesartan 300 MG Oral Tablet] 45 tablet 0    Sig: Take 1/2 (one-half) tablet by mouth once daily     Cardiovascular:  Angiotensin Receptor Blockers Passed - 03/25/2023  6:52 AM      Passed - Cr in normal range and within 180 days    Creatinine, Ser  Date Value Ref Range Status  12/01/2022 0.77 0.57 - 1.00 mg/dL Final         Passed - K in normal range and within 180 days    Potassium  Date Value Ref Range Status  12/01/2022 4.6 3.5 - 5.2 mmol/L Final         Passed - Patient is not pregnant      Passed - Last BP in normal range    BP Readings from Last 1 Encounters:  12/01/22 124/72         Passed - Valid encounter within last 6 months    Recent Outpatient Visits           3 months ago Annual physical exam   [place] [facility] at [facility], Nyoka Cowden, MD   9 months ago Essential hypertension   [place] [facility] at [facility], Nyoka Cowden, MD   1 year ago Annual physical exam   [facility] at [facility], Nyoka Cowden, MD   1 year ago Essential hypertension   [place] [facility] at [facility], Nyoka Cowden, MD   2 years ago Coronary artery disease of native artery of native heart with stable angina pectoris [facility])   [place] [facility] at [facility], Nyoka Cowden, MD       Future Appointments             In 1 week Judithann Graves, Nyoka Cowden, MD [facility] at [facility], [facility]   In 8 months Judithann Graves, Nyoka Cowden, MD [facility] at [facility], [facility]

## 2023-04-02 ENCOUNTER — Ambulatory Visit: Payer: BC Managed Care – PPO | Admitting: Internal Medicine

## 2023-04-02 ENCOUNTER — Encounter: Payer: Self-pay | Admitting: Internal Medicine

## 2023-04-02 VITALS — BP 108/76 | HR 75 | Ht 67.0 in | Wt 155.0 lb

## 2023-04-02 DIAGNOSIS — E782 Mixed hyperlipidemia: Secondary | ICD-10-CM | POA: Diagnosis not present

## 2023-04-02 DIAGNOSIS — R7303 Prediabetes: Secondary | ICD-10-CM

## 2023-04-02 DIAGNOSIS — F5101 Primary insomnia: Secondary | ICD-10-CM

## 2023-04-02 DIAGNOSIS — I10 Essential (primary) hypertension: Secondary | ICD-10-CM

## 2023-04-02 MED ORDER — TEMAZEPAM 15 MG PO CAPS: 15.0000 mg | ORAL_CAPSULE | Freq: Every evening | ORAL | 0 refills | Status: DC | PRN

## 2023-04-02 NOTE — Assessment & Plan Note (Signed)
Normal exam with stable BP on metoprolol and Avapro. No concerns or side effects to current medication. No change in regimen; continue low sodium diet.

## 2023-04-02 NOTE — Assessment & Plan Note (Signed)
Has not responded to over the counter agents Recommend avoiding alcohol as this disrupts normal sleep patterns

## 2023-04-02 NOTE — Patient Instructions (Addendum)
Call [facility] to schedule your mammogram at [phone].  Take the Temazepam just 15 minutes before bedtime (lights out, TV off, eyes closed)

## 2023-04-02 NOTE — Assessment & Plan Note (Signed)
Continues on low carb diet, weight control. No medications at present

## 2023-04-02 NOTE — Assessment & Plan Note (Signed)
LDL is  Lab Results  Component Value Date   LDLCALC 73 12/01/2022   Currently being treated with Crestor in place of atorvastatin with good compliance and no concerns.

## 2023-04-02 NOTE — Progress Notes (Signed)
Date:  04/02/2023   Name:  Teresa Gutierrez   DOB:  Aug 29, 1965   MRN:  [id number]   Chief Complaint: Hyperlipidemia  Hyperlipidemia This is a chronic problem. The problem is uncontrolled. Pertinent negatives include no chest pain or shortness of breath. Current antihyperlipidemic treatment includes statins (meds changed last visit).  Hypertension This is a chronic problem. The problem is controlled. Pertinent negatives include no chest pain, headaches, palpitations or shortness of breath.  Insomnia Primary symptoms: sleep disturbance, difficulty falling asleep, frequent awakening.   The problem occurs nightly.    Lab Results  Component Value Date   NA 139 12/01/2022   K 4.6 12/01/2022   CO2 21 12/01/2022   GLUCOSE 98 12/01/2022   BUN 10 12/01/2022   CREATININE 0.77 12/01/2022   CALCIUM 10.0 12/01/2022   EGFR 90 12/01/2022   GFRNONAA >60 01/28/2021   Lab Results  Component Value Date   CHOL 164 12/01/2022   HDL 62 12/01/2022   LDLCALC 73 12/01/2022   LDLDIRECT 64 04/10/2021   TRIG 175 (H) 12/01/2022   CHOLHDL 2.6 12/01/2022   Lab Results  Component Value Date   TSH 3.070 12/01/2022   Lab Results  Component Value Date   HGBA1C 5.8 (H) 12/01/2022   Lab Results  Component Value Date   WBC 8.0 12/01/2022   HGB 15.1 12/01/2022   HCT 42.6 12/01/2022   MCV 92 12/01/2022   PLT 221 12/01/2022   Lab Results  Component Value Date   ALT 31 12/01/2022   AST 23 12/01/2022   ALKPHOS 137 (H) 12/01/2022   BILITOT 0.5 12/01/2022   No results found for: "25OHVITD2", "25OHVITD3", "VD25OH"   Review of Systems  Constitutional:  Negative for fatigue and unexpected weight change.  HENT:  Negative for nosebleeds.   Eyes:  Negative for visual disturbance.  Respiratory:  Negative for cough, chest tightness, shortness of breath and wheezing.   Cardiovascular:  Negative for chest pain, palpitations and leg swelling.  Gastrointestinal:  Negative for abdominal pain,  constipation and diarrhea.  Neurological:  Negative for dizziness, weakness, light-headedness and headaches.  Psychiatric/Behavioral:  Positive for sleep disturbance. Negative for dysphoric mood. The patient has insomnia. The patient is not nervous/anxious.     Patient Active Problem List   Diagnosis Date Noted   Primary insomnia 04/02/2023   Chondromalacia of both patellae 11/26/2021   Coronary artery disease of native artery of native heart with stable angina pectoris (HCC) 02/10/2021   Prediabetes 02/10/2021   S/P total hysterectomy 10/01/2020   Generalized anxiety disorder 09/01/2017   Eczema 04/20/2017   Back pain of lumbar region with sciatica 04/29/2016   Cervical disc disorder with radiculopathy 04/29/2016   Emphysema of lung (HCC) 12/09/2015   Solitary pulmonary nodule 12/09/2015   Hyperlipidemia 05/21/2015   Tobacco use disorder, severe, in early remission 05/21/2015   Muscle spasms of neck 05/21/2015   Gastroesophageal reflux disease without esophagitis 05/21/2015   Essential hypertension 05/21/2015    Allergies  Allergen Reactions   Other     Bear meat   Wellbutrin [Bupropion]     hostility    Past Surgical History:  Procedure Laterality Date   CORONARY STENT INTERVENTION N/A 01/27/2021   Procedure: CORONARY STENT INTERVENTION;  Surgeon: Marcina Millard, MD;  Location: [facility] INVASIVE CV LAB;  Service: Cardiovascular;  Laterality: N/A;  OM!   LEFT HEART CATH AND CORONARY ANGIOGRAPHY N/A 01/27/2021   Procedure: LEFT HEART CATH AND CORONARY ANGIOGRAPHY;  Surgeon: Marcina Millard, MD;  Location: [facility] INVASIVE CV LAB;  Service: Cardiovascular;  Laterality: N/A;   TOTAL ABDOMINAL HYSTERECTOMY  08/24/2012   ovaries remain    Social History   Tobacco Use   Smoking status: Former    Current packs/day: 0.00    Average packs/day: 0.3 packs/day for 38.5 years (9.6 ttl pk-yrs)    Types: Cigarettes    Start date: 07/16/1982    Quit date: 01/27/2021    Years  since quitting: 2.1   Smokeless tobacco: Never  Vaping Use   Vaping status: Never Used  Substance Use Topics   Alcohol use: Yes    Alcohol/week: 2.0 standard drinks of alcohol    Types: 2 Standard drinks or equivalent per week   Drug use: No     Medication list has been reviewed and updated.  Current Meds  Medication Sig   aspirin 81 MG chewable tablet Chew 1 tablet (81 mg total) by mouth daily.   citalopram (CELEXA) 10 MG tablet Take 1 tablet by mouth once daily   irbesartan (AVAPRO) 300 MG tablet Take 1/2 (one-half) tablet by mouth once daily   metoprolol tartrate (LOPRESSOR) 25 MG tablet Take 1 tablet (25 mg total) by mouth 2 (two) times daily.   nitroGLYCERIN (NITROSTAT) 0.4 MG SL tablet Place 1 tablet (0.4 mg total) under the tongue every 5 (five) minutes as needed for chest pain.   rosuvastatin (CRESTOR) 40 MG tablet Take 1 tablet (40 mg total) by mouth daily.   temazepam (RESTORIL) 15 MG capsule Take 1 capsule (15 mg total) by mouth at bedtime as needed for sleep.   vitamin B-12 (CYANOCOBALAMIN) 500 MCG tablet Take 500 mcg by mouth daily.       04/02/2023    8:09 AM 06/15/2022    8:31 AM 11/26/2021   10:36 AM 04/10/2021    8:05 AM  GAD 7 : Generalized Anxiety Score  Nervous, Anxious, on Edge 0 0 1 0  Control/stop worrying 0 0 0 0  Worry too much - different things 0 0 0 0  Trouble relaxing 1 1 1  0  Restless 0 1 1 0  Easily annoyed or irritable 1 0 1 0  Afraid - awful might happen 0 0 0 0  Total GAD 7 Score 2 2 4  0  Anxiety Difficulty Not difficult at all Not difficult at all Not difficult at all        04/02/2023    8:09 AM 06/15/2022    8:31 AM 11/26/2021   10:36 AM  Depression screen PHQ 2/9  Decreased Interest 1 0 0  Down, Depressed, Hopeless 0 0 0  PHQ - 2 Score 1 0 0  Altered sleeping 3 3 3   Tired, decreased energy 1 1 1   Change in appetite 1 0 0  Feeling bad or failure about yourself  0 0 0  Trouble concentrating 0 0 0  Moving slowly or fidgety/restless 0  0 0  Suicidal thoughts 0 0 0  PHQ-9 Score 6 4 4   Difficult doing work/chores Not difficult at all Somewhat difficult Not difficult at all    BP Readings from Last 3 Encounters:  04/02/23 108/76  12/01/22 124/72  06/15/22 124/78    Physical Exam Vitals and nursing note reviewed.  Constitutional:      General: She is not in acute distress.    Appearance: She is well-developed.  HENT:     Head: Normocephalic and atraumatic.  Neck:     Vascular: No carotid bruit.  Cardiovascular:  Rate and Rhythm: Normal rate and regular rhythm.  Pulmonary:     Effort: Pulmonary effort is normal. No respiratory distress.     Breath sounds: No wheezing or rhonchi.  Musculoskeletal:     Cervical back: Normal range of motion.     Right lower leg: No edema.     Left lower leg: No edema.  Lymphadenopathy:     Cervical: No cervical adenopathy.  Skin:    General: Skin is warm and dry.     Findings: No rash.  Neurological:     Mental Status: She is alert and oriented to person, place, and time.  Psychiatric:        Mood and Affect: Mood normal.        Behavior: Behavior normal.     Wt Readings from Last 3 Encounters:  04/02/23 155 lb (70.3 kg)  12/01/22 161 lb (73 kg)  06/15/22 150 lb 3.2 oz (68.1 kg)    BP 108/76   Pulse 75   Ht 5\' 7"  (1.702 m)   Wt 155 lb (70.3 kg)   SpO2 94%   BMI 24.28 kg/m   Assessment and Plan:  Problem List Items Addressed This Visit       Unprioritized   Primary insomnia    Has not responded to over the counter agents Recommend avoiding alcohol as this disrupts normal sleep patterns      Relevant Medications   temazepam (RESTORIL) 15 MG capsule   Prediabetes (Chronic)    Continues on low carb diet, weight control. No medications at present      Relevant Orders   Hemoglobin A1c   Hyperlipidemia - Primary (Chronic)    LDL is  Lab Results  Component Value Date   LDLCALC 73 12/01/2022   Currently being treated with Crestor in place of  atorvastatin with good compliance and no concerns.       Relevant Orders   Comprehensive metabolic panel   Lipid panel   Essential hypertension (Chronic)    Normal exam with stable BP on metoprolol and Avapro. No concerns or side effects to current medication. No change in regimen; continue low sodium diet.        No follow-ups on file.    Reubin Milan, MD [facility] Primary Care and Sports Medicine [place]

## 2023-04-06 ENCOUNTER — Ambulatory Visit
Admission: RE | Admit: 2023-04-06 | Discharge: 2023-04-06 | Disposition: A | Discharge status: Home / Self Care | Payer: BC Managed Care – PPO | Source: Ambulatory Visit | Attending: Family Medicine | Admitting: Family Medicine

## 2023-04-06 ENCOUNTER — Encounter: Payer: Self-pay | Admitting: Family Medicine

## 2023-04-06 ENCOUNTER — Ambulatory Visit: Payer: BC Managed Care – PPO | Admitting: Family Medicine

## 2023-04-06 ENCOUNTER — Ambulatory Visit
Admission: RE | Admit: 2023-04-06 | Discharge: 2023-04-06 | Disposition: A | Discharge status: Home / Self Care | Payer: BC Managed Care – PPO | Attending: Family Medicine | Admitting: Family Medicine

## 2023-04-06 VITALS — BP 110/68 | HR 70 | Ht 67.0 in | Wt 153.0 lb

## 2023-04-06 DIAGNOSIS — M222X1 Patellofemoral disorders, right knee: Secondary | ICD-10-CM

## 2023-04-06 DIAGNOSIS — M25462 Effusion, left knee: Secondary | ICD-10-CM | POA: Diagnosis not present

## 2023-04-06 DIAGNOSIS — M1712 Unilateral primary osteoarthritis, left knee: Secondary | ICD-10-CM | POA: Diagnosis not present

## 2023-04-06 DIAGNOSIS — M25461 Effusion, right knee: Secondary | ICD-10-CM | POA: Diagnosis not present

## 2023-04-06 DIAGNOSIS — M222X2 Patellofemoral disorders, left knee: Secondary | ICD-10-CM

## 2023-04-06 DIAGNOSIS — M1711 Unilateral primary osteoarthritis, right knee: Secondary | ICD-10-CM | POA: Diagnosis not present

## 2023-04-06 DIAGNOSIS — M25561 Pain in right knee: Secondary | ICD-10-CM | POA: Diagnosis not present

## 2023-04-06 DIAGNOSIS — M25562 Pain in left knee: Secondary | ICD-10-CM | POA: Diagnosis not present

## 2023-04-06 MED ORDER — CELECOXIB 100 MG PO CAPS
100.0000 mg | ORAL_CAPSULE | Freq: Two times a day (BID) | ORAL | 0 refills | Status: DC
Start: 2023-04-06 — End: 2023-04-20

## 2023-04-06 NOTE — Progress Notes (Signed)
     Primary Care / Sports Medicine Office Visit  Patient Information:  Patient ID: Teresa Gutierrez, female DOB: [birth year] Age: 57 y.o. MRN: [id number]   Teresa Gutierrez is a pleasant 57 y.o. female presenting with the following:  Chief Complaint  Patient presents with   Knee Pain    Both, 3+ years, had xrays 2021 in [facility]    Vitals:   04/06/23 0911  BP: 110/68  Pulse: 70  SpO2: 98%   Vitals:   04/06/23 0911  Weight: 153 lb (69.4 kg)  Height: 5\' 7"  (1.702 m)   Body mass index is 23.96 kg/m.  No results found.   Independent interpretation of notes and tests performed by another provider:   None  Procedures performed:   None  Pertinent History, Exam, Impression, and Recommendations:   Teresa Gutierrez was seen today for knee pain.  Patellofemoral arthralgia of both knees Assessment & Plan: Chronic bilateral anterior knee pain, right greater than left, ongoing for roughly 4 years, previously evaluated by outside orthopedic group with x-rays and activity modification advice with limited benefit.  Pain currently interfering with ADLs, difficulty with ascending/descending stairs, sit to stand, walking, some degree of nighttime pain, no radiation, does describe intermittent near buckling but otherwise no mechanical symptoms endorsed.  Examination with trace-1+ effusion on right knee, contralateral benign, range of motion 0-125 on the right, limited by pain, contralateral 0-135, painless, positive crepitus on right, tenderness bilaterally at the lateral patellar facets, secondarily at the medial patellar facets and joint lines, provocative testing demonstrates equivocal McMurray's localizing both laterally and medially, negative drawer testing and Lachman's.  She does have positive single leg squat for pain and increased valgus, right greater than left.  We reviewed outside x-ray reports of bilateral knees from [facility] dated 09/12/2019 demonstrating lateral patellar tilt, osteophytosis,  this appears consistent with her examination findings today and her overall presentation localizes to the patellofemoral articulations bilaterally.  Plan: - Obtain updated x-rays - Start Celebrex twice daily with food (stop if any GI symptoms occur) - Can use ice at 20-minute intervals, Tylenol (acetaminophen) on an as-needed basis for additional pain control - Limit activity using knee symptoms as a guide - Return for follow-up in 2 weeks, contact our office for any questions/concerns between now and then - If suboptimal progress noted or intolerance to oral NSAIDs, low threshold to advance to intra-articular corticosteroid injections, can also consider seeking authorization for viscosupplementation pending x-rays.  Patient will benefit from formal/home-based PT once symptoms allow  Orders: -     DG Knee Complete 4 Views Right; Future -     DG Knee Complete 4 Views Left; Future -     Celecoxib; Take 1 capsule (100 mg total) by mouth 2 (two) times daily.  Dispense: 60 capsule; Refill: 0     Orders & Medications Meds ordered this encounter  Medications   celecoxib (CELEBREX) 100 MG capsule    Sig: Take 1 capsule (100 mg total) by mouth 2 (two) times daily.    Dispense:  60 capsule    Refill:  0   Orders Placed This Encounter  Procedures   DG Knee Complete 4 Views Right   DG Knee Complete 4 Views Left     No follow-ups on file.     Jerrol Banana, MD, [facility]   Primary Care Sports Medicine Primary Care and Sports Medicine at [facility]

## 2023-04-06 NOTE — Patient Instructions (Signed)
-   Obtain updated x-rays - Start Celebrex twice daily with food (stop if any GI symptoms occur) - Can use ice at 20-minute intervals, Tylenol (acetaminophen) on an as-needed basis for additional pain control - Limit activity using knee symptoms as a guide - Return for follow-up in 2 weeks, contact our office for any questions/concerns between now and then

## 2023-04-06 NOTE — Assessment & Plan Note (Signed)
Chronic bilateral anterior knee pain, right greater than left, ongoing for roughly 4 years, previously evaluated by outside orthopedic group with x-rays and activity modification advice with limited benefit.  Pain currently interfering with ADLs, difficulty with ascending/descending stairs, sit to stand, walking, some degree of nighttime pain, no radiation, does describe intermittent near buckling but otherwise no mechanical symptoms endorsed.  Examination with trace-1+ effusion on right knee, contralateral benign, range of motion 0-125 on the right, limited by pain, contralateral 0-135, painless, positive crepitus on right, tenderness bilaterally at the lateral patellar facets, secondarily at the medial patellar facets and joint lines, provocative testing demonstrates equivocal McMurray's localizing both laterally and medially, negative drawer testing and Lachman's.  She does have positive single leg squat for pain and increased valgus, right greater than left.  We reviewed outside x-ray reports of bilateral knees from [facility] dated 09/12/2019 demonstrating lateral patellar tilt, osteophytosis, this appears consistent with her examination findings today and her overall presentation localizes to the patellofemoral articulations bilaterally.  Plan: - Obtain updated x-rays - Start Celebrex twice daily with food (stop if any GI symptoms occur) - Can use ice at 20-minute intervals, Tylenol (acetaminophen) on an as-needed basis for additional pain control - Limit activity using knee symptoms as a guide - Return for follow-up in 2 weeks, contact our office for any questions/concerns between now and then - If suboptimal progress noted or intolerance to oral NSAIDs, low threshold to advance to intra-articular corticosteroid injections, can also consider seeking authorization for viscosupplementation pending x-rays.  Patient will benefit from formal/home-based PT once symptoms allow

## 2023-04-20 ENCOUNTER — Ambulatory Visit: Payer: BC Managed Care – PPO | Admitting: Family Medicine

## 2023-04-20 ENCOUNTER — Encounter: Payer: Self-pay | Admitting: Family Medicine

## 2023-04-20 DIAGNOSIS — M222X2 Patellofemoral disorders, left knee: Secondary | ICD-10-CM | POA: Diagnosis not present

## 2023-04-20 DIAGNOSIS — M222X1 Patellofemoral disorders, right knee: Secondary | ICD-10-CM | POA: Diagnosis not present

## 2023-04-20 MED ORDER — CELECOXIB 100 MG PO CAPS
100.0000 mg | ORAL_CAPSULE | Freq: Two times a day (BID) | ORAL | 2 refills | Status: DC | PRN
Start: 2023-04-20 — End: 2024-05-23

## 2023-04-20 NOTE — Assessment & Plan Note (Signed)
Patient returns for follow-up to bilateral patellofemoral arthralgia, had interval x-rays that do demonstrate degenerative changes about the patella and medial tibiofemoral joint lines.  That being said she has consistently been dosing Celebrex twice daily with excellent response.  Some mild discomfort with descending stairs only, no swelling, no interference with ADLs otherwise.  Has not attempted skipping doses.  Examination demonstrates no effusion bilaterally, full, painless range of motion without crepitus bilaterally, nontender throughout the joint lines and facets.  Clinical and radiographic features are most consistent with osteoarthritis related arthralgia, now controlled.  We reviewed adjunct treatments to further optimize her clinical course.  Plan: - Transition to as needed dosing of Celebrex (take with food) - Start home exercises with information provided and focus on consistency - Can contact us for any breakthrough pain despite the above - Follow-up as needed - Can consider viscosupplementation for further enhancement of symptom control if indicated, additionally cortisone can be used for severe pain episodes despite Celebrex dosing

## 2023-04-20 NOTE — Progress Notes (Signed)
     Primary Care / Sports Medicine Office Visit  Patient Information:  Patient ID: Makinzie Bieri, female DOB: [birth year] Age: 57 y.o. MRN: [id number]   Novalie Kronberg is a pleasant 57 y.o. female presenting with the following:  Chief Complaint  Patient presents with   Knee Pain    Follow up from 04/06/2023. Patient was prescribed Celebrex 100 mg BID. Patient says she is a lot better. Noticed a difference after 3 days of taking medication.    Vitals:   04/20/23 1337  BP: 136/80  Pulse: 69  SpO2: 98%   Vitals:   04/20/23 1337  Weight: 158 lb (71.7 kg)  Height: 5\' 7"  (1.702 m)   Body mass index is 24.75 kg/m.     Independent interpretation of notes and tests performed by another provider:   Independent interpretation of bilateral knee x-rays dated 04/06/2023 demonstrate mild medial tibiofemoral narrowing, lateral patellar facet osteophyte presence bilaterally, no acute osseous processes identified  Procedures performed:   None  Pertinent History, Exam, Impression, and Recommendations:   Monyette was seen today for knee pain.  Patellofemoral arthralgia of both knees Overview:    Assessment & Plan: Patient returns for follow-up to bilateral patellofemoral arthralgia, had interval x-rays that do demonstrate degenerative changes about the patella and medial tibiofemoral joint lines.  That being said she has consistently been dosing Celebrex twice daily with excellent response.  Some mild discomfort with descending stairs only, no swelling, no interference with ADLs otherwise.  Has not attempted skipping doses.  Examination demonstrates no effusion bilaterally, full, painless range of motion without crepitus bilaterally, nontender throughout the joint lines and facets.  Clinical and radiographic features are most consistent with osteoarthritis related arthralgia, now controlled.  We reviewed adjunct treatments to further optimize her clinical course.  Plan: -  Transition to as needed dosing of Celebrex (take with food) - Start home exercises with information provided and focus on consistency - Can contact us for any breakthrough pain despite the above - Follow-up as needed - Can consider viscosupplementation for further enhancement of symptom control if indicated, additionally cortisone can be used for severe pain episodes despite Celebrex dosing  Orders: -     Celecoxib; Take 1 capsule (100 mg total) by mouth 2 (two) times daily as needed.  Dispense: 60 capsule; Refill: 2     Orders & Medications Meds ordered this encounter  Medications   celecoxib (CELEBREX) 100 MG capsule    Sig: Take 1 capsule (100 mg total) by mouth 2 (two) times daily as needed.    Dispense:  60 capsule    Refill:  2   No orders of the defined types were placed in this encounter.    No follow-ups on file.     Jerrol Banana, MD, [facility]   Primary Care Sports Medicine Primary Care and Sports Medicine at [facility]

## 2023-04-20 NOTE — Patient Instructions (Signed)
-   Transition to as needed dosing of Celebrex (take with food) - Start home exercises with information provided and focus on consistency - Can contact us for any breakthrough pain despite the above - Follow-up as needed

## 2023-05-01 ENCOUNTER — Other Ambulatory Visit: Payer: Self-pay | Admitting: Family Medicine

## 2023-05-01 DIAGNOSIS — M222X1 Patellofemoral disorders, right knee: Secondary | ICD-10-CM

## 2023-05-03 NOTE — Telephone Encounter (Signed)
Requested Prescriptions  Pending Prescriptions Disp Refills   celecoxib (CELEBREX) 100 MG capsule [Pharmacy Med Name: Celecoxib 100 MG Oral Capsule] 60 capsule 0    Sig: Take 1 capsule by mouth twice daily     Analgesics:  COX2 Inhibitors Failed - 05/01/2023  9:21 AM      Failed - Manual Review: Labs are only required if the patient has taken medication for more than 8 weeks.      Failed - ALT in normal range and within 360 days    ALT  Date Value Ref Range Status  04/02/2023 37 (H) 0 - 32 IU/L Final         Passed - HGB in normal range and within 360 days    Hemoglobin  Date Value Ref Range Status  12/01/2022 15.1 11.1 - 15.9 g/dL Final         Passed - Cr in normal range and within 360 days    Creatinine, Ser  Date Value Ref Range Status  04/02/2023 0.62 0.57 - 1.00 mg/dL Final         Passed - HCT in normal range and within 360 days    Hematocrit  Date Value Ref Range Status  12/01/2022 42.6 34.0 - 46.6 % Final         Passed - AST in normal range and within 360 days    AST  Date Value Ref Range Status  04/02/2023 30 0 - 40 IU/L Final         Passed - eGFR is 30 or above and within 360 days    GFR calc Af Amer  Date Value Ref Range Status  10/03/2020 116 >59 mL/min/1.73 Final    Comment:    **In accordance with recommendations from the NKF-ASN Task force,**   [facility] is in the process of updating its eGFR calculation to the   2021 CKD-EPI creatinine equation that estimates kidney function   without a race variable.    GFR, Estimated  Date Value Ref Range Status  01/28/2021 >60 >60 mL/min Final    Comment:    (NOTE) Calculated using the CKD-EPI Creatinine Equation (2021)    eGFR  Date Value Ref Range Status  04/02/2023 104 >59 mL/min/1.73 Final         Passed - Patient is not pregnant      Passed - Valid encounter within last 12 months    Recent Outpatient Visits           1 week ago Patellofemoral arthralgia of both knees   [place] [facility] at [facility], Ocie Bob, MD   3 weeks ago Patellofemoral arthralgia of both knees   [place] [facility] at [facility], Ocie Bob, MD   1 month ago Mixed hyperlipidemia   [facility] Primary Care & [facility] Medicine at [facility], Nyoka Cowden, MD   5 months ago Annual physical exam   [facility] Care & Sports Medicine at [facility], Nyoka Cowden, MD   10 months ago Essential hypertension   [facility] Primary Care & Sports Medicine at [facility], Nyoka Cowden, MD       Future Appointments             In 7 months Judithann Graves, Nyoka Cowden, MD [facility] Primary Care & Sports Medicine at [facility], [facility]

## 2023-06-01 DIAGNOSIS — I25118 Atherosclerotic heart disease of native coronary artery with other forms of angina pectoris: Secondary | ICD-10-CM | POA: Diagnosis not present

## 2023-06-01 DIAGNOSIS — I10 Essential (primary) hypertension: Secondary | ICD-10-CM | POA: Diagnosis not present

## 2023-06-01 DIAGNOSIS — E782 Mixed hyperlipidemia: Secondary | ICD-10-CM | POA: Diagnosis not present

## 2023-06-20 ENCOUNTER — Other Ambulatory Visit: Payer: Self-pay | Admitting: Internal Medicine

## 2023-06-20 DIAGNOSIS — I10 Essential (primary) hypertension: Secondary | ICD-10-CM

## 2023-07-27 ENCOUNTER — Other Ambulatory Visit: Payer: Self-pay | Admitting: Internal Medicine

## 2023-07-27 DIAGNOSIS — F411 Generalized anxiety disorder: Secondary | ICD-10-CM

## 2023-07-29 NOTE — Telephone Encounter (Signed)
Requested Prescriptions  Pending Prescriptions Disp Refills   citalopram (CELEXA) 10 MG tablet [Pharmacy Med Name: Citalopram Hydrobromide 10 MG Oral Tablet] 90 tablet 0    Sig: Take 1 tablet by mouth once daily     Psychiatry:  Antidepressants - SSRI Passed - 07/27/2023  6:53 AM      Passed - Valid encounter within last 6 months    Recent Outpatient Visits           3 months ago Patellofemoral arthralgia of both knees   [place] [facility] at [facility], Ocie Bob, MD   3 months ago Patellofemoral arthralgia of both knees   [place] [facility] & Sports Medicine at [facility], Ocie Bob, MD   3 months ago Mixed hyperlipidemia   [facility] & [facility] Medicine at [facility], Nyoka Cowden, MD   8 months ago Annual physical exam   [facility] & [facility] Medicine at [facility], Nyoka Cowden, MD   1 year ago Essential hypertension   [place] [facility] Care & Sports Medicine at [facility], Nyoka Cowden, MD       Future Appointments             In 4 months Judithann Graves, Nyoka Cowden, MD [facility] Primary Care & Sports Medicine at [facility], [facility]

## 2023-10-29 ENCOUNTER — Other Ambulatory Visit: Payer: Self-pay | Admitting: Internal Medicine

## 2023-10-29 DIAGNOSIS — F411 Generalized anxiety disorder: Secondary | ICD-10-CM

## 2023-10-29 NOTE — Telephone Encounter (Signed)
 Requested Prescriptions  Pending Prescriptions Disp Refills   citalopram (CELEXA) 10 MG tablet [Pharmacy Med Name: Citalopram Hydrobromide 10 MG Oral Tablet] 90 tablet 0    Sig: Take 1 tablet by mouth once daily     Psychiatry:  Antidepressants - SSRI Failed - 10/29/2023  2:30 PM      Failed - Valid encounter within last 6 months    Recent Outpatient Visits           6 months ago Patellofemoral arthralgia of both knees   [place] [facility] Medicine at [facility], Ocie Bob, MD   6 months ago Patellofemoral arthralgia of both knees   [place] [facility] & Sports Medicine at [facility], Ocie Bob, MD   7 months ago Mixed hyperlipidemia   [facility] Primary Care & Sports Medicine at [facility], Nyoka Cowden, MD   11 months ago Annual physical exam   [facility] Medicine at [facility], Nyoka Cowden, MD   1 year ago Essential hypertension   [place] Primary Care & Sports Medicine at [facility], Nyoka Cowden, MD       Future Appointments             In 1 month Judithann Graves, Nyoka Cowden, MD [facility] Primary Care & Sports Medicine at [facility], [facility]

## 2023-12-02 ENCOUNTER — Other Ambulatory Visit: Payer: Self-pay | Admitting: Internal Medicine

## 2023-12-02 DIAGNOSIS — E782 Mixed hyperlipidemia: Secondary | ICD-10-CM

## 2023-12-02 NOTE — Telephone Encounter (Signed)
 Requested Prescriptions  Pending Prescriptions Disp Refills   rosuvastatin (CRESTOR) 40 MG tablet [Pharmacy Med Name: Rosuvastatin Calcium 40 MG Oral Tablet] 90 tablet 0    Sig: Take 1 tablet by mouth once daily     Cardiovascular:  Antilipid - Statins 2 Failed - 12/02/2023  3:53 PM      Failed - Valid encounter within last 12 months    Recent Outpatient Visits   None     Future Appointments             Tomorrow Reubin Milan, MD [facility] at [facility], [facility]            Failed - Lipid Panel in normal range within the last 12 months    Cholesterol, Total  Date Value Ref Range Status  04/02/2023 166 100 - 199 mg/dL Final   LDL Chol Calc (NIH)  Date Value Ref Range Status  04/02/2023 69 0 - 99 mg/dL Final   Direct LDL  Date Value Ref Range Status  01/26/2021 138.2 (H) 0 - 99 mg/dL Final    Comment:    Performed at [facility], [address] [place], [place] [address]   LDL Direct  Date Value Ref Range Status  04/10/2021 64 0 - 99 mg/dL Final   HDL  Date Value Ref Range Status  04/02/2023 55 >39 mg/dL Final   Triglycerides  Date Value Ref Range Status  04/02/2023 261 (H) 0 - 149 mg/dL Final         Passed - Cr in normal range and within 360 days    Creatinine, Ser  Date Value Ref Range Status  04/02/2023 0.62 0.57 - 1.00 mg/dL Final         Passed - Patient is not pregnant

## 2023-12-03 ENCOUNTER — Ambulatory Visit (INDEPENDENT_AMBULATORY_CARE_PROVIDER_SITE_OTHER): Payer: Self-pay | Admitting: Internal Medicine

## 2023-12-03 ENCOUNTER — Encounter: Payer: Self-pay | Admitting: Internal Medicine

## 2023-12-03 VITALS — BP 116/78 | HR 71 | Ht 67.0 in | Wt 164.2 lb

## 2023-12-03 DIAGNOSIS — E782 Mixed hyperlipidemia: Secondary | ICD-10-CM

## 2023-12-03 DIAGNOSIS — I25118 Atherosclerotic heart disease of native coronary artery with other forms of angina pectoris: Secondary | ICD-10-CM

## 2023-12-03 DIAGNOSIS — R7303 Prediabetes: Secondary | ICD-10-CM | POA: Diagnosis not present

## 2023-12-03 DIAGNOSIS — I10 Essential (primary) hypertension: Secondary | ICD-10-CM

## 2023-12-03 DIAGNOSIS — Z1231 Encounter for screening mammogram for malignant neoplasm of breast: Secondary | ICD-10-CM | POA: Diagnosis not present

## 2023-12-03 DIAGNOSIS — F411 Generalized anxiety disorder: Secondary | ICD-10-CM

## 2023-12-03 DIAGNOSIS — Z0000 Encounter for general adult medical examination without abnormal findings: Secondary | ICD-10-CM | POA: Diagnosis not present

## 2023-12-03 DIAGNOSIS — H6121 Impacted cerumen, right ear: Secondary | ICD-10-CM

## 2023-12-03 DIAGNOSIS — Z1211 Encounter for screening for malignant neoplasm of colon: Secondary | ICD-10-CM | POA: Diagnosis not present

## 2023-12-03 DIAGNOSIS — Z23 Encounter for immunization: Secondary | ICD-10-CM | POA: Diagnosis not present

## 2023-12-03 NOTE — Assessment & Plan Note (Signed)
 Managed with diet only. Lab Results  Component Value Date   HGBA1C 5.9 (H) 04/02/2023

## 2023-12-03 NOTE — Assessment & Plan Note (Signed)
 Blood pressure is well controlled.  Current medications irbesartan and metoprolol. Will continue same regimen along with efforts to limit dietary sodium.

## 2023-12-03 NOTE — Assessment & Plan Note (Signed)
 Followed by Cardiology On BB, statin and ASA

## 2023-12-03 NOTE — Assessment & Plan Note (Signed)
 Clinically stable on Celexa.   No SI or HI on evaluation. Sleep is not excellent but had no benefit from temazepam. Plan to continue same medications for now.

## 2023-12-03 NOTE — Assessment & Plan Note (Signed)
 LDL is  Lab Results  Component Value Date   LDLCALC 69 04/02/2023   Current regimen is atorvastatin.  No medication side effects noted. Goal LDL is <70.

## 2023-12-03 NOTE — Progress Notes (Signed)
 Date:  12/03/2023   Name:  Teresa Gutierrez   DOB:  04-09-1966   MRN:  [id number]   Chief Complaint: Annual Exam and Ear Fullness (Patient said she has been experiencing right ear fullness) Teresa Gutierrez is a 58 y.o. female who presents today for her Complete Annual Exam. She feels well. She reports exercising none. She reports she is sleeping poorly. Breast complaints none.  Health Maintenance  Topic Date Due   Mammogram  11/11/2021   Colon Cancer Screening  12/02/2024*   Stool Blood Test  12/21/2023   Flu Shot  03/24/2024   DTaP/Tdap/Td vaccine (2 - Td or Tdap) 01/17/2029   Pneumococcal Vaccination  Completed   COVID-19 Vaccine  Completed   Hepatitis C Screening  Completed   HIV Screening  Completed   Zoster (Shingles) Vaccine  Completed   HPV Vaccine  Aged Out   Meningitis B Vaccine  Aged Out  *Topic was postponed. The date shown is not the original due date.    Hypertension This is a chronic problem. The problem is controlled. Pertinent negatives include no chest pain, headaches, palpitations or shortness of breath. Past treatments include angiotensin blockers and beta blockers. Hypertensive end-organ damage includes CAD/MI. There is no history of kidney disease or CVA.  Hyperlipidemia This is a chronic problem. The problem is controlled. Pertinent negatives include no chest pain, myalgias or shortness of breath. Current antihyperlipidemic treatment includes statins. The current treatment provides significant improvement of lipids.  Depression        This is a chronic problem.The problem is unchanged.  Associated symptoms include no fatigue, no myalgias and no headaches.  Past treatments include SSRIs - Selective serotonin reuptake inhibitors.  Compliance with treatment is good. Otalgia  There is pain in the right ear. This is a new problem. The current episode started in the past 7 days. Associated symptoms include ear discharge. Pertinent negatives include no abdominal  pain, coughing, diarrhea or headaches.    Review of Systems  Constitutional:  Negative for fatigue and unexpected weight change.  HENT:  Positive for ear discharge and ear pain. Negative for trouble swallowing.   Eyes:  Negative for visual disturbance.  Respiratory:  Negative for cough, chest tightness, shortness of breath and wheezing.   Cardiovascular:  Negative for chest pain, palpitations and leg swelling.  Gastrointestinal:  Negative for abdominal pain, constipation and diarrhea.  Musculoskeletal:  Negative for arthralgias and myalgias.  Neurological:  Negative for dizziness, weakness, light-headedness and headaches.  Psychiatric/Behavioral:  Positive for depression.      Lab Results  Component Value Date   NA 140 04/02/2023   K 5.1 04/02/2023   CO2 21 04/02/2023   GLUCOSE 99 04/02/2023   BUN 10 04/02/2023   CREATININE 0.62 04/02/2023   CALCIUM 9.9 04/02/2023   EGFR 104 04/02/2023   GFRNONAA >60 01/28/2021   Lab Results  Component Value Date   CHOL 166 04/02/2023   HDL 55 04/02/2023   LDLCALC 69 04/02/2023   LDLDIRECT 64 04/10/2021   TRIG 261 (H) 04/02/2023   CHOLHDL 3.0 04/02/2023   Lab Results  Component Value Date   TSH 3.070 12/01/2022   Lab Results  Component Value Date   HGBA1C 5.9 (H) 04/02/2023   Lab Results  Component Value Date   WBC 8.0 12/01/2022   HGB 15.1 12/01/2022   HCT 42.6 12/01/2022   MCV 92 12/01/2022   PLT 221 12/01/2022   Lab Results  Component Value Date   ALT  37 (H) 04/02/2023   AST 30 04/02/2023   ALKPHOS 128 (H) 04/02/2023   BILITOT 0.3 04/02/2023   No results found for: "25OHVITD2", "25OHVITD3", "VD25OH"   Patient Active Problem List   Diagnosis Date Noted   Primary insomnia 04/02/2023   Patellofemoral arthralgia of both knees 11/26/2021   Coronary artery disease of native artery of native heart with stable angina pectoris (HCC) 02/10/2021   Prediabetes 02/10/2021   S/P total hysterectomy 10/01/2020   Generalized  anxiety disorder 09/01/2017   Eczema 04/20/2017   Back pain of lumbar region with sciatica 04/29/2016   Cervical disc disorder with radiculopathy 04/29/2016   Emphysema of lung (HCC) 12/09/2015   Solitary pulmonary nodule 12/09/2015   Hyperlipidemia 05/21/2015   Tobacco use disorder, severe, in early remission 05/21/2015   Muscle spasms of neck 05/21/2015   Gastroesophageal reflux disease without esophagitis 05/21/2015   Essential hypertension 05/21/2015    Allergies  Allergen Reactions   Other     Bear meat   Wellbutrin [Bupropion]     hostility    Past Surgical History:  Procedure Laterality Date   CORONARY STENT INTERVENTION N/A 01/27/2021   Procedure: CORONARY STENT INTERVENTION;  Surgeon: Marcina Millard, MD;  Location: [facility] INVASIVE CV LAB;  Service: Cardiovascular;  Laterality: N/A;  OM!   LEFT HEART CATH AND CORONARY ANGIOGRAPHY N/A 01/27/2021   Procedure: LEFT HEART CATH AND CORONARY ANGIOGRAPHY;  Surgeon: Marcina Millard, MD;  Location: [facility] INVASIVE CV LAB;  Service: Cardiovascular;  Laterality: N/A;   TOTAL ABDOMINAL HYSTERECTOMY  08/24/2012   ovaries remain    Social History   Tobacco Use   Smoking status: Former    Current packs/day: 0.00    Average packs/day: 0.3 packs/day for 38.5 years (9.6 ttl pk-yrs)    Types: Cigarettes    Start date: 07/16/1982    Quit date: 01/27/2021    Years since quitting: 2.8   Smokeless tobacco: Never  Vaping Use   Vaping status: Never Used  Substance Use Topics   Alcohol use: Yes    Alcohol/week: 2.0 standard drinks of alcohol    Types: 2 Standard drinks or equivalent per week   Drug use: No     Medication list has been reviewed and updated.  Current Meds  Medication Sig   aspirin 81 MG chewable tablet Chew 1 tablet (81 mg total) by mouth daily.   celecoxib (CELEBREX) 100 MG capsule Take 1 capsule (100 mg total) by mouth 2 (two) times daily as needed.   citalopram (CELEXA) 10 MG tablet Take 1 tablet by  mouth once daily   irbesartan (AVAPRO) 300 MG tablet Take 1/2 (one-half) tablet by mouth once daily   metoprolol tartrate (LOPRESSOR) 25 MG tablet Take 1 tablet (25 mg total) by mouth 2 (two) times daily.   nitroGLYCERIN (NITROSTAT) 0.4 MG SL tablet Place 1 tablet (0.4 mg total) under the tongue every 5 (five) minutes as needed for chest pain.   rosuvastatin (CRESTOR) 40 MG tablet Take 1 tablet by mouth once daily   vitamin B-12 (CYANOCOBALAMIN) 500 MCG tablet Take 500 mcg by mouth daily.       12/03/2023    8:07 AM 04/02/2023    8:09 AM 06/15/2022    8:31 AM 11/26/2021   10:36 AM  GAD 7 : Generalized Anxiety Score  Nervous, Anxious, on Edge 0 0 0 1  Control/stop worrying 0 0 0 0  Worry too much - different things 0 0 0 0  Trouble relaxing 0 1  1 1  Restless 0 0 1 1  Easily annoyed or irritable 0 1 0 1  Afraid - awful might happen 0 0 0 0  Total GAD 7 Score 0 2 2 4   Anxiety Difficulty Not difficult at all Not difficult at all Not difficult at all Not difficult at all       12/03/2023    8:07 AM 04/02/2023    8:09 AM 06/15/2022    8:31 AM  Depression screen PHQ 2/9  Decreased Interest 1 1 0  Down, Depressed, Hopeless 0 0 0  PHQ - 2 Score 1 1 0  Altered sleeping 2 3 3   Tired, decreased energy 1 1 1   Change in appetite 0 1 0  Feeling bad or failure about yourself  0 0 0  Trouble concentrating 0 0 0  Moving slowly or fidgety/restless 0 0 0  Suicidal thoughts 0 0 0  PHQ-9 Score 4 6 4   Difficult doing work/chores Not difficult at all Not difficult at all Somewhat difficult    BP Readings from Last 3 Encounters:  12/03/23 116/78  04/20/23 136/80  04/06/23 110/68    Physical Exam Vitals and nursing note reviewed.  Constitutional:      General: She is not in acute distress.    Appearance: She is well-developed.  HENT:     Head: Normocephalic and atraumatic.     Right Ear: There is impacted cerumen.     Left Ear: Tympanic membrane and ear canal normal.     Nose:     Right  Sinus: No maxillary sinus tenderness.     Left Sinus: No maxillary sinus tenderness.  Eyes:     General: No scleral icterus.       Right eye: No discharge.        Left eye: No discharge.     Conjunctiva/sclera: Conjunctivae normal.  Neck:     Thyroid: No thyromegaly.     Vascular: No carotid bruit.  Cardiovascular:     Rate and Rhythm: Normal rate and regular rhythm.     Pulses: Normal pulses.     Heart sounds: Normal heart sounds.  Pulmonary:     Effort: Pulmonary effort is normal. No respiratory distress.     Breath sounds: No wheezing.  Chest:  Breasts:    Right: No mass, nipple discharge, skin change or tenderness.     Left: No mass, nipple discharge, skin change or tenderness.  Abdominal:     General: Bowel sounds are normal.     Palpations: Abdomen is soft.     Tenderness: There is no abdominal tenderness.  Musculoskeletal:     Cervical back: Normal range of motion. No erythema.     Right lower leg: No edema.     Left lower leg: No edema.  Lymphadenopathy:     Cervical: No cervical adenopathy.  Skin:    General: Skin is warm and dry.     Findings: No rash.  Neurological:     Mental Status: She is alert and oriented to person, place, and time.     Cranial Nerves: No cranial nerve deficit.     Sensory: No sensory deficit.     Deep Tendon Reflexes: Reflexes are normal and symmetric.  Psychiatric:        Attention and Perception: Attention normal.        Mood and Affect: Mood normal.     Wt Readings from Last 3 Encounters:  12/03/23 164 lb 4 oz (74.5 kg)  04/20/23 158 lb (71.7 kg)  04/06/23 153 lb (69.4 kg)    BP 116/78   Pulse 71   Ht 5\' 7"  (1.702 m)   Wt 164 lb 4 oz (74.5 kg)   SpO2 95%   BMI 25.73 kg/m   Assessment and Plan:  Problem List Items Addressed This Visit       Unprioritized   Hyperlipidemia (Chronic)   LDL is  Lab Results  Component Value Date   LDLCALC 69 04/02/2023   Current regimen is atorvastatin.  No medication side effects  noted. Goal LDL is <70.       Relevant Orders   Lipid panel   Essential hypertension (Chronic)   Blood pressure is well controlled.  Current medications irbesartan and metoprolol. Will continue same regimen along with efforts to limit dietary sodium.       Relevant Orders   CBC with Differential/Platelet   Comprehensive metabolic panel with GFR   TSH   Urinalysis, Routine w reflex microscopic   Generalized anxiety disorder (Chronic)   Clinically stable on Celexa.   No SI or HI on evaluation. Sleep is not excellent but had no benefit from temazepam. Plan to continue same medications for now.       Coronary artery disease of native artery of native heart with stable angina pectoris (HCC) (Chronic)   Followed by Cardiology On BB, statin and ASA      Relevant Orders   CBC with Differential/Platelet   Comprehensive metabolic panel with GFR   Prediabetes (Chronic)   Managed with diet only. Lab Results  Component Value Date   HGBA1C 5.9 (H) 04/02/2023         Relevant Orders   Hemoglobin A1c   Other Visit Diagnoses       Annual physical exam    -  Primary   up to date on immunizations after today she will schedule mammogram due for annual FIT   Relevant Orders   CBC with Differential/Platelet   Comprehensive metabolic panel with GFR   Hemoglobin A1c   Lipid panel   TSH   Urinalysis, Routine w reflex microscopic     Encounter for screening mammogram for breast cancer       schedule at [facility]   Relevant Orders   MM 3D SCREENING MAMMOGRAM BILATERAL BREAST     Colon cancer screening       declines colonoscopy continue annual FIT   Relevant Orders   Fecal occult blood, imunochemical     Impacted cerumen, right ear         Immunization due       Relevant Orders   Pfizer Comirnaty Covid-19 Vaccine 13yrs & older (Completed)       No follow-ups on file.    Reubin Milan, MD [facility] Medicine [place]

## 2023-12-03 NOTE — Patient Instructions (Signed)
 Call [facility] to schedule your mammogram at [phone].

## 2023-12-04 LAB — CBC WITH DIFFERENTIAL/PLATELET
Basophils Absolute: 0.1 10*3/uL (ref 0.0–0.2)
Basos: 1 %
EOS (ABSOLUTE): 0.1 10*3/uL (ref 0.0–0.4)
Eos: 2 %
Hematocrit: 45.3 % (ref 34.0–46.6)
Hemoglobin: 15.4 g/dL (ref 11.1–15.9)
Immature Grans (Abs): 0 10*3/uL (ref 0.0–0.1)
Immature Granulocytes: 0 %
Lymphocytes Absolute: 1.6 10*3/uL (ref 0.7–3.1)
Lymphs: 22 %
MCH: 32.4 pg (ref 26.6–33.0)
MCHC: 34 g/dL (ref 31.5–35.7)
MCV: 95 fL (ref 79–97)
Monocytes Absolute: 0.6 10*3/uL (ref 0.1–0.9)
Monocytes: 8 %
Neutrophils Absolute: 5.1 10*3/uL (ref 1.4–7.0)
Neutrophils: 67 %
Platelets: 214 10*3/uL (ref 150–450)
RBC: 4.75 x10E6/uL (ref 3.77–5.28)
RDW: 12.4 % (ref 11.7–15.4)
WBC: 7.5 10*3/uL (ref 3.4–10.8)

## 2023-12-04 LAB — COMPREHENSIVE METABOLIC PANEL WITH GFR
ALT: 27 IU/L (ref 0–32)
AST: 25 IU/L (ref 0–40)
Albumin: 5 g/dL — ABNORMAL HIGH (ref 3.8–4.9)
Alkaline Phosphatase: 122 IU/L — ABNORMAL HIGH (ref 44–121)
BUN/Creatinine Ratio: 13 (ref 9–23)
BUN: 11 mg/dL (ref 6–24)
Bilirubin Total: 0.5 mg/dL (ref 0.0–1.2)
CO2: 23 mmol/L (ref 20–29)
Calcium: 10.1 mg/dL (ref 8.7–10.2)
Chloride: 101 mmol/L (ref 96–106)
Creatinine, Ser: 0.84 mg/dL (ref 0.57–1.00)
Globulin, Total: 2 g/dL (ref 1.5–4.5)
Glucose: 106 mg/dL — ABNORMAL HIGH (ref 70–99)
Potassium: 5.2 mmol/L (ref 3.5–5.2)
Sodium: 142 mmol/L (ref 134–144)
Total Protein: 7 g/dL (ref 6.0–8.5)
eGFR: 81 mL/min/{1.73_m2} (ref >59)

## 2023-12-04 LAB — URINALYSIS, ROUTINE W REFLEX MICROSCOPIC
Bilirubin, UA: NEGATIVE
Glucose, UA: NEGATIVE
Ketones, UA: NEGATIVE
Nitrite, UA: NEGATIVE
Protein,UA: NEGATIVE
RBC, UA: NEGATIVE
Specific Gravity, UA: 1.01 (ref 1.005–1.030)
Urobilinogen, Ur: 0.2 mg/dL (ref 0.2–1.0)
pH, UA: 6 (ref 5.0–7.5)

## 2023-12-04 LAB — LIPID PANEL
Chol/HDL Ratio: 2.6 ratio (ref 0.0–4.4)
Cholesterol, Total: 155 mg/dL (ref 100–199)
HDL: 60 mg/dL (ref >39)
LDL Chol Calc (NIH): 70 mg/dL (ref 0–99)
Triglycerides: 143 mg/dL (ref 0–149)
VLDL Cholesterol Cal: 25 mg/dL (ref 5–40)

## 2023-12-04 LAB — MICROSCOPIC EXAMINATION
Bacteria, UA: NONE SEEN
Casts: NONE SEEN /LPF
RBC, Urine: NONE SEEN /HPF (ref 0–2)

## 2023-12-04 LAB — HEMOGLOBIN A1C
Est. average glucose Bld gHb Est-mCnc: 120 mg/dL
Hgb A1c MFr Bld: 5.8 % — ABNORMAL HIGH (ref 4.8–5.6)

## 2023-12-04 LAB — TSH: TSH: 2.18 u[IU]/mL (ref 0.450–4.500)

## 2023-12-05 ENCOUNTER — Encounter: Payer: Self-pay | Admitting: Internal Medicine

## 2024-01-28 ENCOUNTER — Other Ambulatory Visit: Payer: Self-pay | Admitting: Internal Medicine

## 2024-01-28 DIAGNOSIS — F411 Generalized anxiety disorder: Secondary | ICD-10-CM

## 2024-01-28 NOTE — Telephone Encounter (Signed)
 Requested Prescriptions  Pending Prescriptions Disp Refills   citalopram  (CELEXA ) 10 MG tablet [Pharmacy Med Name: Citalopram  Hydrobromide 10 MG Oral Tablet] 90 tablet 1    Sig: Take 1 tablet by mouth once daily     Psychiatry:  Antidepressants - SSRI Passed - 01/28/2024  4:08 PM      Passed - Valid encounter within last 6 months    Recent Outpatient Visits           1 month ago Annual physical exam   [facility] at [facility], Chales [place], MD

## 2024-03-06 ENCOUNTER — Other Ambulatory Visit: Payer: Self-pay | Admitting: Internal Medicine

## 2024-03-06 DIAGNOSIS — E782 Mixed hyperlipidemia: Secondary | ICD-10-CM

## 2024-03-07 NOTE — Telephone Encounter (Signed)
 Requested Prescriptions  Pending Prescriptions Disp Refills   rosuvastatin  (CRESTOR ) 40 MG tablet [Pharmacy Med Name: Rosuvastatin  Calcium  40 MG Oral Tablet] 90 tablet 0    Sig: Take 1 tablet by mouth once daily     Cardiovascular:  Antilipid - Statins 2 Failed - 03/07/2024  2:54 PM      Failed - Lipid Panel in normal range within the last 12 months    Cholesterol, Total  Date Value Ref Range Status  12/03/2023 155 100 - 199 mg/dL Final   LDL Chol Calc (NIH)  Date Value Ref Range Status  12/03/2023 70 0 - 99 mg/dL Final   Direct LDL  Date Value Ref Range Status  01/26/2021 138.2 (H) 0 - 99 mg/dL Final    Comment:    Performed at [facility], [address] [place], [place] [address]   LDL Direct  Date Value Ref Range Status  04/10/2021 64 0 - 99 mg/dL Final   HDL  Date Value Ref Range Status  12/03/2023 60 >39 mg/dL Final   Triglycerides  Date Value Ref Range Status  12/03/2023 143 0 - 149 mg/dL Final         Passed - Cr in normal range and within 360 days    Creatinine, Ser  Date Value Ref Range Status  12/03/2023 0.84 0.57 - 1.00 mg/dL Final         Passed - Patient is not pregnant      Passed - Valid encounter within last 12 months    Recent Outpatient Visits           3 months ago Annual physical exam   [facility] at [facility], Leita DEL, MD

## 2024-05-18 ENCOUNTER — Other Ambulatory Visit: Payer: Self-pay | Admitting: Internal Medicine

## 2024-05-18 DIAGNOSIS — F411 Generalized anxiety disorder: Secondary | ICD-10-CM

## 2024-05-18 DIAGNOSIS — E782 Mixed hyperlipidemia: Secondary | ICD-10-CM

## 2024-05-18 DIAGNOSIS — I10 Essential (primary) hypertension: Secondary | ICD-10-CM

## 2024-05-19 NOTE — Telephone Encounter (Signed)
 Requested Prescriptions  Pending Prescriptions Disp Refills   irbesartan  (AVAPRO ) 300 MG tablet [Pharmacy Med Name: Irbesartan  300 MG Oral Tablet] 45 tablet 0    Sig: Take 1/2 (one-half) tablet by mouth once daily     Cardiovascular:  Angiotensin Receptor Blockers Passed - 05/19/2024  3:00 PM      Passed - Cr in normal range and within 180 days    Creatinine, Ser  Date Value Ref Range Status  12/03/2023 0.84 0.57 - 1.00 mg/dL Final         Passed - K in normal range and within 180 days    Potassium  Date Value Ref Range Status  12/03/2023 5.2 3.5 - 5.2 mmol/L Final         Passed - Patient is not pregnant      Passed - Last BP in normal range    BP Readings from Last 1 Encounters:  12/03/23 116/78         Passed - Valid encounter within last 6 months    Recent Outpatient Visits           5 months ago Annual physical exam   [place] [facility] at [facility], Leita DEL, MD               rosuvastatin  (CRESTOR ) 40 MG tablet [Pharmacy Med Name: Rosuvastatin  Calcium  40 MG Oral Tablet] 90 tablet 1    Sig: Take 1 tablet by mouth once daily     Cardiovascular:  Antilipid - Statins 2 Failed - 05/19/2024  3:00 PM      Failed - Lipid Panel in normal range within the last 12 months    Cholesterol, Total  Date Value Ref Range Status  12/03/2023 155 100 - 199 mg/dL Final   LDL Chol Calc (NIH)  Date Value Ref Range Status  12/03/2023 70 0 - 99 mg/dL Final   Direct LDL  Date Value Ref Range Status  01/26/2021 138.2 (H) 0 - 99 mg/dL Final    Comment:    Performed at [facility], [address], [place], [place] [address]   LDL Direct  Date Value Ref Range Status  04/10/2021 64 0 - 99 mg/dL Final   HDL  Date Value Ref Range Status  12/03/2023 60 >39 mg/dL Final   Triglycerides  Date Value Ref Range Status  12/03/2023 143 0 - 149 mg/dL Final         Passed - Cr in normal range and within 360 days    Creatinine, Ser  Date  Value Ref Range Status  12/03/2023 0.84 0.57 - 1.00 mg/dL Final         Passed - Patient is not pregnant      Passed - Valid encounter within last 12 months    Recent Outpatient Visits           5 months ago Annual physical exam   [place] [facility] Medicine at [facility], Leita DEL, MD              Refused Prescriptions Disp Refills   citalopram  (CELEXA ) 10 MG tablet [Pharmacy Med Name: Citalopram  Hydrobromide 10 MG Oral Tablet] 90 tablet 0    Sig: Take 1 tablet by mouth once daily     Psychiatry:  Antidepressants - SSRI Passed - 05/19/2024  3:00 PM      Passed - Valid encounter within last 6 months    Recent Outpatient  Visits           5 months ago Annual physical exam   [facility] at [facility], Leita DEL, MD

## 2024-05-23 ENCOUNTER — Encounter: Payer: Self-pay | Admitting: Internal Medicine

## 2024-05-23 ENCOUNTER — Ambulatory Visit: Admitting: Internal Medicine

## 2024-05-23 VITALS — BP 118/76 | HR 91 | Ht 67.0 in | Wt 162.0 lb

## 2024-05-23 DIAGNOSIS — Z1211 Encounter for screening for malignant neoplasm of colon: Secondary | ICD-10-CM | POA: Diagnosis not present

## 2024-05-23 DIAGNOSIS — F411 Generalized anxiety disorder: Secondary | ICD-10-CM | POA: Diagnosis not present

## 2024-05-23 DIAGNOSIS — I10 Essential (primary) hypertension: Secondary | ICD-10-CM

## 2024-05-23 DIAGNOSIS — E782 Mixed hyperlipidemia: Secondary | ICD-10-CM | POA: Diagnosis not present

## 2024-05-23 MED ORDER — IRBESARTAN 300 MG PO TABS
150.0000 mg | ORAL_TABLET | Freq: Every day | ORAL | 1 refills | Status: AC
Start: 2024-05-23 — End: ?

## 2024-05-23 MED ORDER — ROSUVASTATIN CALCIUM 40 MG PO TABS: 40.0000 mg | ORAL_TABLET | Freq: Every day | ORAL | 1 refills | Status: AC

## 2024-05-23 MED ORDER — CITALOPRAM HYDROBROMIDE 10 MG PO TABS: 10.0000 mg | ORAL_TABLET | Freq: Every day | ORAL | 1 refills | Status: AC

## 2024-05-23 NOTE — Assessment & Plan Note (Signed)
 Clinically stable on Celexa .   No SI or HI on evaluation. She has had some stress with her husbands health this year but the situation is stabilizing. Recommend the same medications for now.

## 2024-05-23 NOTE — Patient Instructions (Signed)
 Call [facility] to schedule your mammogram at [phone].

## 2024-05-23 NOTE — Assessment & Plan Note (Signed)
 LDL is  Lab Results  Component Value Date   LDLCALC 70 12/03/2023   Currently taking atorvastatin .  No medication side effects or other concerns. Recommended LDL goal is < 70.

## 2024-05-23 NOTE — Progress Notes (Signed)
 Date:  05/23/2024   Name:  Teresa Gutierrez   DOB:  04/19/1966   MRN:  [id number]   Chief Complaint: Hypertension  Hypertension This is a chronic problem. The problem is controlled. Associated symptoms include anxiety. Pertinent negatives include no chest pain, headaches, palpitations or shortness of breath. Past treatments include angiotensin blockers. The current treatment provides significant improvement.  Anxiety Presents for follow-up visit. Patient reports no chest pain, dizziness, palpitations or shortness of breath. Symptoms occur occasionally. The severity of symptoms is mild. The quality of sleep is good.      Review of Systems  Constitutional:  Negative for fatigue and unexpected weight change.  HENT:  Negative for trouble swallowing.   Eyes:  Negative for visual disturbance.  Respiratory:  Negative for cough, chest tightness, shortness of breath and wheezing.   Cardiovascular:  Negative for chest pain, palpitations and leg swelling.  Gastrointestinal:  Negative for abdominal pain, constipation and diarrhea.  Musculoskeletal:  Negative for arthralgias and myalgias.  Neurological:  Negative for dizziness, weakness, light-headedness and headaches.     Lab Results  Component Value Date   NA 142 12/03/2023   K 5.2 12/03/2023   CO2 23 12/03/2023   GLUCOSE 106 (H) 12/03/2023   BUN 11 12/03/2023   CREATININE 0.84 12/03/2023   CALCIUM  10.1 12/03/2023   EGFR 81 12/03/2023   GFRNONAA >60 01/28/2021   Lab Results  Component Value Date   CHOL 155 12/03/2023   HDL 60 12/03/2023   LDLCALC 70 12/03/2023   LDLDIRECT 64 04/10/2021   TRIG 143 12/03/2023   CHOLHDL 2.6 12/03/2023   Lab Results  Component Value Date   TSH 2.180 12/03/2023   Lab Results  Component Value Date   HGBA1C 5.8 (H) 12/03/2023   Lab Results  Component Value Date   WBC 7.5 12/03/2023   HGB 15.4 12/03/2023   HCT 45.3 12/03/2023   MCV 95 12/03/2023   PLT 214 12/03/2023   Lab Results   Component Value Date   ALT 27 12/03/2023   AST 25 12/03/2023   ALKPHOS 122 (H) 12/03/2023   BILITOT 0.5 12/03/2023   No results found for: MARIEN BOLLS, VD25OH   Patient Active Problem List   Diagnosis Date Noted   Primary insomnia 04/02/2023   Patellofemoral arthralgia of both knees 11/26/2021   Coronary artery disease of native artery of native heart with stable angina pectoris 02/10/2021   Prediabetes 02/10/2021   S/P total hysterectomy 10/01/2020   Generalized anxiety disorder 09/01/2017   Eczema 04/20/2017   Back pain of lumbar region with sciatica 04/29/2016   Cervical disc disorder with radiculopathy 04/29/2016   Emphysema of lung (HCC) 12/09/2015   Solitary pulmonary nodule 12/09/2015   Hyperlipidemia 05/21/2015   Tobacco use disorder, severe, in early remission 05/21/2015   Muscle spasms of neck 05/21/2015   Gastroesophageal reflux disease without esophagitis 05/21/2015   Essential hypertension 05/21/2015    Allergies  Allergen Reactions   Other     Bear meat   Wellbutrin [Bupropion]     hostility    Past Surgical History:  Procedure Laterality Date   CORONARY STENT INTERVENTION N/A 01/27/2021   Procedure: CORONARY STENT INTERVENTION;  Surgeon: Ammon Blunt, MD;  Location: [facility];  Service: Cardiovascular;  Laterality: N/A;  OM!   LEFT HEART CATH AND CORONARY ANGIOGRAPHY N/A 01/27/2021   Procedure: LEFT HEART CATH AND CORONARY ANGIOGRAPHY;  Surgeon: Ammon Blunt, MD;  Location: [facility];  Service: Cardiovascular;  Laterality: N/A;   TOTAL ABDOMINAL HYSTERECTOMY  08/24/2012   ovaries remain    Social History   Tobacco Use   Smoking status: Former    Current packs/day: 0.00    Average packs/day: 0.3 packs/day for 38.5 years (9.6 ttl pk-yrs)    Types: Cigarettes    Start date: 07/16/1982    Quit date: 01/27/2021    Years since quitting: 3.3   Smokeless tobacco: Never  Vaping Use   Vaping status:  Never Used  Substance Use Topics   Alcohol use: Yes    Alcohol/week: 2.0 standard drinks of alcohol    Types: 2 Standard drinks or equivalent per week   Drug use: No     Medication list has been reviewed and updated.  Current Meds  Medication Sig   nitroGLYCERIN  (NITROSTAT ) 0.4 MG SL tablet Place 1 tablet (0.4 mg total) under the tongue every 5 (five) minutes as needed for chest pain.   vitamin B-12 (CYANOCOBALAMIN) 500 MCG tablet Take 500 mcg by mouth daily.   [DISCONTINUED] aspirin  81 MG chewable tablet Chew 1 tablet (81 mg total) by mouth daily.   [DISCONTINUED] celecoxib  (CELEBREX ) 100 MG capsule Take 1 capsule (100 mg total) by mouth 2 (two) times daily as needed.   [DISCONTINUED] citalopram  (CELEXA ) 10 MG tablet Take 1 tablet by mouth once daily   [DISCONTINUED] irbesartan  (AVAPRO ) 300 MG tablet Take 1/2 (one-half) tablet by mouth once daily   [DISCONTINUED] metoprolol  tartrate (LOPRESSOR ) 25 MG tablet Take 1 tablet (25 mg total) by mouth 2 (two) times daily.   [DISCONTINUED] rosuvastatin  (CRESTOR ) 40 MG tablet Take 1 tablet by mouth once daily       05/23/2024    9:54 AM 12/03/2023    8:07 AM 04/02/2023    8:09 AM 06/15/2022    8:31 AM  GAD 7 : Generalized Anxiety Score  Nervous, Anxious, on Edge 1 0 0 0  Control/stop worrying 1 0 0 0  Worry too much - different things 1 0 0 0  Trouble relaxing 1 0 1 1  Restless 1 0 0 1  Easily annoyed or irritable 1 0 1 0  Afraid - awful might happen 1 0 0 0  Total GAD 7 Score 7 0 2 2  Anxiety Difficulty Somewhat difficult Not difficult at all Not difficult at all Not difficult at all       05/23/2024    9:54 AM 12/03/2023    8:07 AM 04/02/2023    8:09 AM  Depression screen PHQ 2/9  Decreased Interest 1 1 1   Down, Depressed, Hopeless 0 0 0  PHQ - 2 Score 1 1 1   Altered sleeping 1 2 3   Tired, decreased energy 1 1 1   Change in appetite 0 0 1  Feeling bad or failure about yourself  0 0 0  Trouble concentrating 0 0 0  Moving slowly  or fidgety/restless 0 0 0  Suicidal thoughts 0 0 0  PHQ-9 Score 3 4 6   Difficult doing work/chores Not difficult at all Not difficult at all Not difficult at all    BP Readings from Last 3 Encounters:  05/23/24 118/76  12/03/23 116/78  04/20/23 136/80    Physical Exam Vitals and nursing note reviewed.  Constitutional:      General: She is not in acute distress.    Appearance: She is well-developed.  HENT:     Head: Normocephalic and atraumatic.  Cardiovascular:     Rate and Rhythm: Normal rate and regular  rhythm.  Pulmonary:     Effort: Pulmonary effort is normal. No respiratory distress.     Breath sounds: No wheezing or rhonchi.  Musculoskeletal:     Cervical back: Normal range of motion.     Right lower leg: No edema.     Left lower leg: No edema.  Skin:    General: Skin is warm and dry.     Findings: No rash.  Neurological:     General: No focal deficit present.     Mental Status: She is alert and oriented to person, place, and time.  Psychiatric:        Mood and Affect: Mood normal.        Behavior: Behavior normal.     Wt Readings from Last 3 Encounters:  05/23/24 162 lb (73.5 kg)  12/03/23 164 lb 4 oz (74.5 kg)  04/20/23 158 lb (71.7 kg)    BP 118/76   Pulse 91   Ht 5' 7 (1.702 m)   Wt 162 lb (73.5 kg)   SpO2 97%   BMI 25.37 kg/m   Assessment and Plan:  Problem List Items Addressed This Visit       Unprioritized   Essential hypertension - Primary (Chronic)   Blood pressure is well controlled on olmesartan. No medication side effects noted. Plan to continue current medications.       Relevant Medications   irbesartan  (AVAPRO ) 300 MG tablet   rosuvastatin  (CRESTOR ) 40 MG tablet   Generalized anxiety disorder (Chronic)   Clinically stable on Celexa .   No SI or HI on evaluation. She has had some stress with her husbands health this year but the situation is stabilizing. Recommend the same medications for now.       Relevant Medications    citalopram  (CELEXA ) 10 MG tablet   Hyperlipidemia (Chronic)   LDL is  Lab Results  Component Value Date   LDLCALC 70 12/03/2023   Currently taking atorvastatin .  No medication side effects or other concerns. Recommended LDL goal is < 70.       Relevant Medications   irbesartan  (AVAPRO ) 300 MG tablet   rosuvastatin  (CRESTOR ) 40 MG tablet   Other Visit Diagnoses       Colon cancer screening       Relevant Orders   Fecal occult blood, imunochemical       Return in about 6 months (around 11/20/2024) for [facility] new PCP.    Leita HILARIO Adie, MD [facility] [place]

## 2024-05-23 NOTE — Assessment & Plan Note (Signed)
 Blood pressure is well controlled on olmesartan. No medication side effects noted. Plan to continue current medications.

## 2024-05-30 DIAGNOSIS — I10 Essential (primary) hypertension: Secondary | ICD-10-CM | POA: Diagnosis not present

## 2024-05-30 DIAGNOSIS — I25118 Atherosclerotic heart disease of native coronary artery with other forms of angina pectoris: Secondary | ICD-10-CM | POA: Diagnosis not present

## 2024-05-30 DIAGNOSIS — E782 Mixed hyperlipidemia: Secondary | ICD-10-CM | POA: Diagnosis not present

## 2024-06-10 LAB — SPECIMEN STATUS REPORT

## 2024-06-10 LAB — FECAL OCCULT BLOOD, IMMUNOCHEMICAL: Fecal Occult Bld: NEGATIVE

## 2024-06-11 ENCOUNTER — Ambulatory Visit: Payer: Self-pay | Admitting: Internal Medicine

## 2024-11-21 ENCOUNTER — Encounter: Admitting: Family Medicine
